# Patient Record
Sex: Female | Born: 1950 | Race: Black or African American | Hispanic: No | Marital: Married | Smoking: Never smoker
Health system: Southern US, Community
[De-identification: ages and names within clinical notes are randomized; demographics above are authoritative.]

## PROBLEM LIST (undated history)

## (undated) DIAGNOSIS — D649 Anemia, unspecified: Secondary | ICD-10-CM

## (undated) DIAGNOSIS — I10 Essential (primary) hypertension: Secondary | ICD-10-CM

## (undated) HISTORY — PX: ABDOMINAL HYSTERECTOMY: SHX81

---

## 2001-05-15 ENCOUNTER — Ambulatory Visit (HOSPITAL_COMMUNITY): Admission: RE | Admit: 2001-05-15 | Discharge: 2001-05-15 | Payer: Self-pay | Admitting: Family Medicine

## 2001-05-15 ENCOUNTER — Encounter: Payer: Self-pay | Admitting: Family Medicine

## 2002-05-18 ENCOUNTER — Encounter: Payer: Self-pay | Admitting: Family Medicine

## 2002-05-18 ENCOUNTER — Ambulatory Visit (HOSPITAL_COMMUNITY): Admission: RE | Admit: 2002-05-18 | Discharge: 2002-05-18 | Payer: Self-pay | Admitting: Family Medicine

## 2003-06-01 ENCOUNTER — Ambulatory Visit (HOSPITAL_COMMUNITY): Admission: RE | Admit: 2003-06-01 | Discharge: 2003-06-01 | Payer: Self-pay | Admitting: Family Medicine

## 2003-12-28 ENCOUNTER — Inpatient Hospital Stay (HOSPITAL_COMMUNITY): Admission: AD | Admit: 2003-12-28 | Discharge: 2003-12-31 | Payer: Self-pay | Admitting: Family Medicine

## 2008-12-18 ENCOUNTER — Emergency Department (HOSPITAL_COMMUNITY): Admission: EM | Admit: 2008-12-18 | Discharge: 2008-12-18 | Payer: Self-pay | Admitting: Emergency Medicine

## 2009-02-04 ENCOUNTER — Emergency Department (HOSPITAL_COMMUNITY): Admission: EM | Admit: 2009-02-04 | Discharge: 2009-02-04 | Payer: Self-pay | Admitting: Emergency Medicine

## 2009-08-16 ENCOUNTER — Emergency Department (HOSPITAL_COMMUNITY): Admission: EM | Admit: 2009-08-16 | Discharge: 2009-08-16 | Payer: Self-pay | Admitting: Emergency Medicine

## 2009-10-26 ENCOUNTER — Emergency Department (HOSPITAL_COMMUNITY): Admission: EM | Admit: 2009-10-26 | Discharge: 2009-10-26 | Payer: Self-pay | Admitting: Emergency Medicine

## 2010-09-09 LAB — URINALYSIS, ROUTINE W REFLEX MICROSCOPIC
Glucose, UA: NEGATIVE mg/dL
Hgb urine dipstick: NEGATIVE
Ketones, ur: NEGATIVE mg/dL
Protein, ur: NEGATIVE mg/dL
Urobilinogen, UA: 0.2 mg/dL (ref 0.0–1.0)

## 2010-11-02 NOTE — H&P (Signed)
NAME:  Janet Short, Janet Short                        ACCOUNT NO.:  [id number]   MEDICAL RECORD NO.:  [id number]                   PATIENT TYPE:  INP   LOCATION:  A310                                 FACILITY:  [facility]   PHYSICIAN:  Annia Friendly. Loleta Chance, M.D.                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  The patient is admitted for  treatment of moderate dehydration based on lab work on December 26, 2003, in the  office.  Office lab on December 26, 2003, revealed the following:  BUN 71,  creatinine 3.1, sodium 125, potassium 5.6, chloride 89, CO2 22.  The patient  has been experiencing weakness, general malaise, increased thirst, increased  frequency of urination as presenting symptoms on the office visit of December 20, 2003.  Serum blood sugar in the office revealed a value of 750 with a BUN 56  and creatinine of 1.6.  The patient wanted to be treated as an outpatient  due to lack of insurance.  Urine culture collected on December 21, 2003, grew  greater than 100,000 per mL of E coli.  The patient daily with Avandia,  NovoLog 70/30 and Levaquin.  The patient indicated that she felt better with  the treatment plan.  However, labs on December 26, 2003, demonstrated increase  in BUN and creatinine.  Therefore, the patient was admitted as stated  earlier.  Medical history is also positive for hypertension.  Medical  history is negative for tuberculosis, cancer, sickle cell, asthma and  seizure disorder.   MEDICATIONS:  Prescribed medications on admission:  1. Ziac 10 mg p.o. every day.  2. NovoLog 70/30 40 units subcu every morning and 10 units subcu every     evening.  3. Avandia 4 mg p.o. every day.  4. Diovan 160 mg p.o. every day.   ALLERGIES:  The patient is not allergic to any known medication.   HABITS:   Negative for use of ethanol and tobacco products.  The patient  denies the use of street drugs.   SEXUALLY TRANSMITTED DISEASE HISTORY:  Negative for gonorrhea, syphilis,  herpes, and HIV infection.   PAST MEDICAL HISTORY:  Positive for hospitalization for pneumonia at [facility], and hysterectomy secondary to uterine fibroids.   FAMILY HISTORY:  Mother deceased at age 70 secondary to natural causes;  father deceased at age 62 secondary to cancer, (type unknown).  Two brothers  living at age 61 and age 47, health unknown; three brothers deceased, one in  his 5s secondary to complications of fall, age 65 secondary to  complications of diabetes, and one in his 34s, cause unknown; four sisters  are living at age 32 with history of hypertension and diabetes,  one in her  27s with a history of hypertension, age 45 with a history of hypertension,  and age 3 with a history of hypertension.   REVIEW OF SYSTEMS:  Negative for epistaxis, bleeding gums, chronic cough,  hemoptysis, shortness of breath, chest pain, dysuria, gross hematuria,  vaginal bleeding, edema of legs, melena, diarrhea, constipation, significant  weight loss, etc.   Review of systems positive for headache recently, lack of energy, episodic  vaginal itching, episodic nausea and vomiting over the past two weeks.   PHYSICAL EXAMINATION:  GENERAL APPEARANCE:  A slightly short, medium-framed,  alert, middle-aged black female, who appeared not to feel well, but no  apparent respiratory distress.  VITAL SIGNS:  Temperature 98.2, pulse 78, respirations 20.  Blood pressure  93/56.  HEENT:  Head normocephalic. Ears:  Normal auricles.  External canals patent.  Tympanic membrane pearly gray.  Eyes:  Lids negative for ptosis.  Sclerae  white.  Pupils round, equal and reactive to light.  Extraocular movements  intact.  Nose negative for discharge.  Mouth:  Positive missing teeth.  Remaining dentition good.  No bleeding gums.   Posterior pharynx positive for  white pin-head size ulcers involving distal roof of mouth.  NECK:  Negative for adenopathy or thyromegaly.  Supraclavicular space:  No  palpable nodes.  LUNGS:  Clear.  HEART:  Audible S1 and S2 without murmur.  Regular rate and rhythm.  BREASTS:  No skin changes. No nodule on palpation, nipples erect without  discharge.  ABDOMEN:  Obese, hyperactive bowel sounds.  Soft, nontender all four  quadrants.  No palpable mass.  No organomegaly.  PELVIC:  External genitalia normal female.  RECTAL:  Deferred.  EXTREMITIES:  No edema of tibia.  No joint swelling, no joint redness, no  joint hotness.  Palpable dorsalis pedis bilaterally.  NEUROLOGICAL:  Cranial nerves II-XII appeared intact.   LABORATORY DATA:  White count 10.1, hemoglobin 10.4, hematocrit 29.6,  platelets 598,000.  Sodium 128, potassium 5.5, chloride 92, CO2 27, glucose  112, BUN 50, creatinine 2.2, calcium 8.8.  Urinalysis:  Specific gravity  1.015, pH 5, no glucose, no bilirubin, no ketones, large amount of blood,  protein 30 mg/dl, nitrite negative, large amount of leukocytes, many  bacteria.   IMPRESSION:  1. Moderate dehydration.  2. New onset diabetes mellitus.  3. Electrolyte imbalance.  4. Treated urinary tract infection.   SECONDARY DIAGNOSIS:  Hypertension.   PLAN:  1. IV fluids using normal saline at 200 cc per hour.  2. Retic count and anemia panel.  3. Guaiac stool daily x2.  4. Diet:  Carbohydrate modified, 4 gram sodium.  5. Fasting lipid profile.  6. Accu-Chek a.c. and bedtime.  7. Hemoglobin A1c.  8. Avandamet 2/500 p.o. every day with breakfast.  9. Novolin 70/30 40 units subcu every morning.  10.      Diabetic education.  11.      Ziac 10 mg one tablet p.o. every day.  12.      Diovan 80 mg p.o. daily.  13.      Iron supplements.      ___________________________________________                                            Annia Friendly. Loleta Chance, M.D.  [name]  D:   12/28/2003  T:  12/28/2003  Job:  [id number]

## 2010-11-02 NOTE — Discharge Summary (Signed)
NAME:  Janet Short, Janet Short                        ACCOUNT NO.:  [id number]   MEDICAL RECORD NO.:  [id number]                   PATIENT TYPE:  INP   LOCATION:  A310                                 FACILITY:  [facility]   PHYSICIAN:  Annia Friendly. Loleta Chance, M.D.                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  The patient was admitted for treatment of  moderate dehydration based on lab work, of December 26, 2003, in the office.  Office lab, on December 26, 2003, revealed the following:  BUN 71, creatinine  3.1, sodium 125, potassium 5.6, chloride 89, CO2 22.  The patient had been  experiencing weakness, general malaise, increased thirst, increased  frequency of urination, and presented to the office on December 20, 2003.  A  blood sugar, in the office, revealed a value of 750 with a BUN of 56 and  creatinine 1.6.  The patient wanted to be treated as an outpatient due to  lack of insurance.  Urine culture collected, on December 21, 2003, grew greater  than 100,000 colonies per ml of E. coli.  The patient was treated daily with  Avandia, Novolog 70/30, and Levaquin.  The patient indicated that she felt  better with this treatment plan; however, labs on December 26, 2003,  demonstrated increasing BUN and creatinine; therefore, the patient was  admitted as stated earlier.   MEDICAL HISTORY:  Positive for hypertension.   The patient was not allergic to any known medication.   HABITS:  Negative for use of ethanol or tobacco products.   FAMILY HISTORY:  Revealed mother deceased at age 43 secondary to natural  causes.  Father deceased at age 68 secondary to cancer (type unknown).  Two  brothers living ages 10 and 83 and their health unknown.  Three brothers  deceased, one in his 80s secondary to complication of fall, age 45  secondary  to complications of diabetes, and one in his 86s cause unknown.  Four  sisters living,  age 43 with a history of hypertension and diabetes, one in  her 45s with a history of hypertension, age 63 with a history of  hypertension, and age 55 with a history for hypertension.   PAST MEDICAL HISTORY:  1. Positive hospitalization for pneumonia at [facility].  2. Hysterectomy secondary to uterine fibroids.   PROBLEM LIST:  1. Moderate dehydration.   PHYSICAL EXAMINATION:  GENERAL APPEARANCE:  Revealed a slightly short,  overweight, medium framed, middle-aged, black female who appeared not to  feel well but no apparent respiratory distress.  SKIN:  Warm and dry.  HEENT:  Mouth demonstrated missing teeth and remaining dentition good.  Oral  mucosa was moist.  LUNGS:  Clear.  HEART:  Revealed audible S1 and S2 without murmur.  Rhythm is regular and  rate within normal limits.  ABDOMEN:  Obese with hypoactive bowel sounds.  Abdomen was soft and  nontender in all four quadrants.  Abdominal exam demonstrated no palpable  mass or organomegaly.  EXTREMITIES:  Demonstrated no tibial edema.  NEUROLOGIC:  Intact.   SIGNIFICANT LABS ON ADMISSION:  White count 10.1, hemoglobin 10.4,  hematocrit 29.6, platelets 598,000.  Sodium 128, potassium 5.5, chloride 92,  CO2 27, glucose 112, BUN 15, creatinine 2.2.  Urinalysis demonstrated the  following:  Status post 1.015, pH 5, no glucose, no bilirubin, no ketones,  large amount of blood, protein 30 mg/dL, nitrate negative, large amount of  leukocyte and many bacteria.  The patient was treated with IV fluids using normal saline at 200 cc/hr x 24  hours, then 150 cc/hr.  Accu-Chek a.c. and bedtime.  MET-7 early morning x  3.  A dietician consult and other supportive measures.  Repeat electrolytes,  on December 29, 2003, revealed the following:  Sodium 133, potassium 5.9,  chloride 101, CO2 26, glucose 137, BUN 32, creatinine 1.6.  Repeat   electrolytes, on December 30, 2003, were as follows:  Sodium 137, potassium 4.9,  chloride 105, CO2 25, glucose 122, BUN 12, creatinine 1.1.  The patient  feels significantly better.  She has remained alert and oriented to person,  place and time.  Energy level is returning.  Her appetite is good.  The  patient will be discharged to home on December 30, 2003.  1. New onset of diabetes mellitus on insulin.  Serum glucose, on admission,     was 112.  Her hemoglobin A1c was 10.6% (normal 4.6 to 6.1).  A urinalysis     revealed no glucose.  The patient's blood sugar has been controlled well     throughout this hospitalization.  She has remained alert and oriented to     person, place and time.  She has no complaint of nausea, vomiting, or     stomach pain at the time of discharge.  The patient will be continued on     insulin as she was treated with insulin during this hospitalization.  She     will monitor her glucose from 2-3 times per day.  The dietician did see     the patient during this hospitalization.  The patient will be discharged     to home on December 31, 2003.  2. A treated urinary tract infection secondary to Escherichia coli.  The     patient has been continued on Levaquin 250 mg p.o. every day.  A urine     culture has demonstrated no growth thus far.  History is negative for     dysuria, gross hematuria, back pain, fever, chills, or suprapubic pain at     the time of discharge.  3. Hyperlipidemia.  A fasting lipid profile collected, on December 29, 2003 at     0505, demonstrated the following:  Total cholesterol 151 mg/dL,     triglycerides 045 mg/dL, HDL 22 mg/dL, LDL 82 mg/dL.  The patient has     been treated with a low cholesterol diet in addition to calorie and     sodium restriction.  The patient will be treated with cholesterol     lowering medicine as an outpatient.  4. Chronic anemia.  Hemoglobin, on admission, was 10.4, hematocrit 29.6.    Anemia panel demonstrated the following,  on December 29, 2003 at 0505:  Iron     30 mcg/dL (normal 42 to 981), total iron-binding capacity 222 pcg/dL     (normal 191 to 478), iron percent saturation 14 (normal 20-55), B-12     greater than 2,000, serum folate 10.3 ng/ml, ferritin 764 ng/ml.  The     patient is on an iron supplement.  She will be continued on iron     supplement as an outpatient.  The order was given to guaiac stools during     this hospitalization.  5. Treated urinary tract infection secondary to E. coli.  The patient will     be continued on Levaquin 250 mg p.o. every day times a total of 7 days.     The patient is not complaining of dysuria, gross hematuria, back pain,     fever, or chills.  Urine culture thus far has demonstrated no growth.     Urine on admission demonstrated the presence of a large amount of     leukocyte and many bacteria.   INSTRUCTIONS AT TIME OF DISCHARGE:  1. Diet:  Carbohydrate modified, 1600 calorie, low salt and cholesterol.  2. Activity:  No restriction.  3. Medications:     a. Aspirin 81 mg p.o. every day.     b. Avandomet 2/500 one tablet twice a day with meals.     c. Novolog 70/30, 30 units subcu every morning.     d. Ziac 10 mg one tablet every day.     e. Ferrous sulfate 325 mg one tablet every day.     f. Tylenol 500 mg p.o. q.6-8h. p.r.n. for pain.  4. Accu-Chek 7 a.m. and 5 p.m. every day.  5. Followup in the office x 1 week.   FINAL PRIMARY DIAGNOSES:  1. Moderate dehydration.  2. New onset diabetes mellitus.  3. Electrolyte imbalance.  4. Treated urinary tract infection.   SECONDARY DIAGNOSES:  1. Hypertension.  2. Hypertriglyceridemia.     ___________________________________________                                         Annia Friendly. Loleta Chance, M.D.   [name]  D:  12/30/2003  T:  12/30/2003  Job:  [id number]

## 2011-12-06 ENCOUNTER — Emergency Department (HOSPITAL_COMMUNITY)
Admission: EM | Admit: 2011-12-06 | Discharge: 2011-12-06 | Disposition: A | Discharge status: Home / Self Care | Payer: Self-pay | Attending: Emergency Medicine | Admitting: Emergency Medicine

## 2011-12-06 ENCOUNTER — Encounter (HOSPITAL_COMMUNITY): Payer: Self-pay | Admitting: *Deleted

## 2011-12-06 DIAGNOSIS — K029 Dental caries, unspecified: Secondary | ICD-10-CM | POA: Insufficient documentation

## 2011-12-06 DIAGNOSIS — I10 Essential (primary) hypertension: Secondary | ICD-10-CM | POA: Insufficient documentation

## 2011-12-06 DIAGNOSIS — E119 Type 2 diabetes mellitus without complications: Secondary | ICD-10-CM | POA: Insufficient documentation

## 2011-12-06 DIAGNOSIS — K0889 Other specified disorders of teeth and supporting structures: Secondary | ICD-10-CM

## 2011-12-06 HISTORY — DX: Essential (primary) hypertension: I10

## 2011-12-06 MED ORDER — IBUPROFEN 800 MG PO TABS
800.0000 mg | ORAL_TABLET | Freq: Once | ORAL | Status: AC
  Administered 2011-12-06: 800 mg via ORAL
  Filled 2011-12-06: qty 1

## 2011-12-06 MED ORDER — PENICILLIN V POTASSIUM 500 MG PO TABS: 500.0000 mg | ORAL_TABLET | Freq: Four times a day (QID) | ORAL | Status: AC

## 2011-12-06 MED ORDER — HYDROCODONE-ACETAMINOPHEN 5-325 MG PO TABS: 1.0000 | ORAL_TABLET | Freq: Four times a day (QID) | ORAL | Status: AC | PRN

## 2011-12-06 MED ORDER — PENICILLIN V POTASSIUM 250 MG PO TABS
500.0000 mg | ORAL_TABLET | Freq: Once | ORAL | Status: AC
  Administered 2011-12-06: 500 mg via ORAL
  Filled 2011-12-06: qty 2

## 2011-12-06 MED ORDER — HYDROCODONE-ACETAMINOPHEN 5-325 MG PO TABS
1.0000 | ORAL_TABLET | Freq: Once | ORAL | Status: AC
  Administered 2011-12-06: 1 via ORAL
  Filled 2011-12-06: qty 1

## 2011-12-06 NOTE — Discharge Instructions (Signed)
Dental Pain  A tooth ache may be caused by cavities (tooth decay). Cavities expose the nerve of the tooth to air and hot or cold temperatures. It may come from an infection or abscess (also called a boil or furuncle) around your tooth. It is also often caused by dental caries (tooth decay). This causes the pain you are having.  DIAGNOSIS   Your caregiver can diagnose this problem by exam.  TREATMENT   · If caused by an infection, it may be treated with medications which kill germs (antibiotics) and pain medications as prescribed by your caregiver. Take medications as directed.  · Only take over-the-counter or prescription medicines for pain, discomfort, or fever as directed by your caregiver.  · Whether the tooth ache today is caused by infection or dental disease, you should see your dentist as soon as possible for further care.  SEEK MEDICAL CARE IF:  The exam and treatment you received today has been provided on an emergency basis only. This is not a substitute for complete medical or dental care. If your problem worsens or new problems (symptoms) appear, and you are unable to meet with your dentist, call or return to this location.  SEEK IMMEDIATE MEDICAL CARE IF:   · You have a fever.  · You develop redness and swelling of your face, jaw, or neck.  · You are unable to open your mouth.  · You have severe pain uncontrolled by pain medicine.  MAKE SURE YOU:   · Understand these instructions.  · Will watch your condition.  · Will get help right away if you are not doing well or get worse.  Document Released: 06/03/2005 Document Revised: 05/23/2011 Document Reviewed: 01/20/2008  [employer]® Patient Information ©2012 [employer].

## 2011-12-06 NOTE — ED Notes (Signed)
Pain rt mandibular molar.  Onset  Last pm

## 2011-12-06 NOTE — ED Provider Notes (Signed)
History     CSN: [id number]  Arrival date & time 12/06/11  1207   First MD Initiated Contact with Patient 12/06/11 1326      Chief Complaint  Patient presents with  . Dental Pain    (Consider location/radiation/quality/duration/timing/severity/associated sxs/prior treatment) HPI Comments: Started hurting in R upper molar area last PM.  No fever or chills.  Has dentist at [facility].  Patient is a 61 y.o. female presenting with tooth pain. The history is provided by the patient. No language interpreter was used.  Dental PainThe primary symptoms include mouth pain. Primary symptoms do not include dental injury or fever. The symptoms are unchanged.  Additional symptoms do not include: trismus and facial swelling.    Past Medical History  Diagnosis Date  . Diabetes mellitus   . Hypertension     Past Surgical History  Procedure Date  . Abdominal hysterectomy     History reviewed. No pertinent family history.  History  Substance Use Topics  . Smoking status: Never Smoker   . Smokeless tobacco: Not on file  . Alcohol Use: Yes    OB History    Grav Para Term Preterm Abortions TAB SAB Ect Mult Living                  Review of Systems  Constitutional: Negative for fever and chills.  HENT: Negative for facial swelling.   All other systems reviewed and are negative.    Allergies  Review of patient's allergies indicates no known allergies.  Home Medications   Current Outpatient Rx  Name Route Sig Dispense Refill  . LISINOPRIL 20 MG PO TABS Oral Take 20 mg by mouth daily.    Marland Kitchen METFORMIN HCL 500 MG PO TABS Oral Take 500 mg by mouth 2 (two) times daily with a meal.    . PRESCRIPTION MEDICATION Oral Take 10 mg by mouth daily. For blood pressure    . HYDROCODONE-ACETAMINOPHEN 5-325 MG PO TABS Oral Take 1 tablet by mouth every 6 (six) hours as needed for pain. 20 tablet 0  . PENICILLIN V POTASSIUM 500 MG PO TABS Oral Take 1 tablet (500 mg total) by mouth 4 (four) times  daily. 40 tablet 0    BP 177/76  Pulse 61  Temp 97.6 F (36.4 C) (Oral)  Resp 20  Ht 5\' 1"  (1.549 m)  Wt 187 lb (84.823 kg)  BMI 35.33 kg/m2  SpO2 100%  Physical Exam  Nursing note and vitals reviewed. Constitutional: She is oriented to person, place, and time. She appears well-developed and well-nourished. No distress.  HENT:  Head: Normocephalic and atraumatic.  Mouth/Throat: Uvula is midline, oropharynx is clear and moist and mucous membranes are normal. Dental caries present. No dental abscesses or uvula swelling.    Eyes: EOM are normal.  Neck: Normal range of motion.  Cardiovascular: Normal rate, regular rhythm and normal heart sounds.   Pulmonary/Chest: Effort normal and breath sounds normal.  Abdominal: Soft. She exhibits no distension. There is no tenderness.  Musculoskeletal: Normal range of motion.  Neurological: She is alert and oriented to person, place, and time.  Skin: Skin is warm and dry.  Psychiatric: She has a normal mood and affect. Judgment normal.    ED Course  Procedures (including critical care time)  Labs Reviewed - No data to display No results found.   1. Pain, dental       MDM  No obvious abscess. rx-hydrocodone, 20 rx- pen VK 500 mg , 40 OTC  ibuprofen F/u with your dentist ASAP        Worthy Rancher, [place] 12/06/11 1403

## 2011-12-06 NOTE — ED Provider Notes (Signed)
Medical screening examination/treatment/procedure(s) were performed by non-physician practitioner and as supervising physician I was immediately available for consultation/collaboration.  Shelda Jakes, MD 12/06/11 [phone]

## 2013-02-01 ENCOUNTER — Encounter (HOSPITAL_COMMUNITY): Payer: Self-pay | Admitting: Emergency Medicine

## 2013-02-01 ENCOUNTER — Emergency Department (HOSPITAL_COMMUNITY)
Admission: EM | Admit: 2013-02-01 | Discharge: 2013-02-01 | Disposition: A | Discharge status: Home / Self Care | Payer: BC Managed Care – PPO | Attending: Emergency Medicine | Admitting: Emergency Medicine

## 2013-02-01 ENCOUNTER — Emergency Department (HOSPITAL_COMMUNITY): Payer: BC Managed Care – PPO

## 2013-02-01 DIAGNOSIS — Y929 Unspecified place or not applicable: Secondary | ICD-10-CM | POA: Insufficient documentation

## 2013-02-01 DIAGNOSIS — Y9389 Activity, other specified: Secondary | ICD-10-CM | POA: Insufficient documentation

## 2013-02-01 DIAGNOSIS — I10 Essential (primary) hypertension: Secondary | ICD-10-CM | POA: Insufficient documentation

## 2013-02-01 DIAGNOSIS — E119 Type 2 diabetes mellitus without complications: Secondary | ICD-10-CM | POA: Insufficient documentation

## 2013-02-01 DIAGNOSIS — S8982XA Other specified injuries of left lower leg, initial encounter: Secondary | ICD-10-CM

## 2013-02-01 DIAGNOSIS — S8990XA Unspecified injury of unspecified lower leg, initial encounter: Secondary | ICD-10-CM | POA: Insufficient documentation

## 2013-02-01 DIAGNOSIS — Z7982 Long term (current) use of aspirin: Secondary | ICD-10-CM | POA: Insufficient documentation

## 2013-02-01 DIAGNOSIS — X500XXA Overexertion from strenuous movement or load, initial encounter: Secondary | ICD-10-CM | POA: Insufficient documentation

## 2013-02-01 DIAGNOSIS — Z79899 Other long term (current) drug therapy: Secondary | ICD-10-CM | POA: Insufficient documentation

## 2013-02-01 NOTE — ED Notes (Signed)
States that she started having pain in her left calf today while at work.  States her leg is painful when trying to bear weight.

## 2013-02-01 NOTE — ED Provider Notes (Signed)
CSN: [id number]     Arrival date & time 02/01/13  1154 History     First MD Initiated Contact with Patient 02/01/13 1242     Chief Complaint  Patient presents with  . Leg Pain   (Consider location/radiation/quality/duration/timing/severity/associated sxs/prior Treatment) HPI Comments: Janet Short is a 62 y.o. Female who states that her knee has been hurting since this morning. She has pain when she attempts to walk. She is unable to work because of the pain. She states for the last 2 nights. She has been "exercising" and thinks that she may have hurt her knee. The exercises that she is doing is forcefully kicking her legs straight out, while standing.  She is worried that she has knocked her joint out of place. She has not tried any medication for it. She denies lower leg pain and thigh pain, back, pain, weakness, dizziness, shortness of breath, chest pain, nausea or vomiting. There are no other known modifying factors.  Patient is a 62 y.o. female presenting with leg pain. The history is provided by the patient.  Leg Pain   Past Medical History  Diagnosis Date  . Diabetes mellitus   . Hypertension    Past Surgical History  Procedure Laterality Date  . Abdominal hysterectomy     No family history on file. History  Substance Use Topics  . Smoking status: Never Smoker   . Smokeless tobacco: Not on file  . Alcohol Use: Yes   OB History   Grav Para Term Preterm Abortions TAB SAB Ect Mult Living                 Review of Systems  All other systems reviewed and are negative.    Allergies  Review of patient's allergies indicates no known allergies.  Home Medications   Current Outpatient Rx  Name  Route  Sig  Dispense  Refill  . aspirin EC 81 MG tablet   Oral   Take 81 mg by mouth daily.         . bisoprolol-hydrochlorothiazide (ZIAC) 10-6.25 MG per tablet   Oral   Take 1 tablet by mouth daily.         Marland Kitchen lisinopril (PRINIVIL,ZESTRIL) 20 MG tablet   Oral  Take 20 mg by mouth daily.         . metFORMIN (GLUCOPHAGE) 500 MG tablet   Oral   Take 500 mg by mouth 2 (two) times daily with a meal.          BP 145/67  Pulse 75  Temp(Src) 98 F (36.7 C) (Oral)  Resp 18  Ht 5\' 1"  (1.549 m)  Wt 182 lb (82.555 kg)  BMI 34.41 kg/m2  SpO2 99% Physical Exam  Nursing note and vitals reviewed. Constitutional: She is oriented to person, place, and time. She appears well-developed.  Overweight  HENT:  Head: Normocephalic and atraumatic.  Eyes: Conjunctivae and EOM are normal. Pupils are equal, round, and reactive to light.  Neck: Normal range of motion and phonation normal. Neck supple.  Cardiovascular: Normal rate and intact distal pulses.   Pulmonary/Chest: Effort normal. She exhibits no tenderness.  Musculoskeletal:  Decreased active range of motion left knee, secondary to pain. Tenderness in the left popliteal space. There is no associated swelling or deformity of the left knee. There is no lower extremity edema, swelling or localized tenderness. There is normal  Pulse and sensation in the left foot.  Neurological: She is alert and oriented to person, place,  and time. She has normal strength. She exhibits normal muscle tone.  Skin: Skin is warm and dry.  Psychiatric: She has a normal mood and affect. Her behavior is normal. Judgment and thought content normal.    ED Course   Procedures (including critical care time)  AcE wrap applied by nursing.  Labs Reviewed - No data to display Dg Knee Complete 4 Views Left  02/01/2013   *RADIOLOGY REPORT*  Clinical Data: Twisting injury 2 days ago.  Left knee pain.  LEFT KNEE - COMPLETE 4+ VIEW  Comparison: Plain films left knee 02/04/2009.  Findings: No acute bony or joint abnormality is identified.  No joint effusion is present.  Mild degenerative change is seen about the knee.  IMPRESSION: No acute finding.   Original Report Authenticated By: Holley Dexter, M.D.   1. Knee hyperextension  injury, left, initial encounter     MDM  Minor knee trauma, without fracture. Doubt DVT, Cellulitis, ligamentous injury.      Nursing Notes Reviewed/ Care Coordinated Applicable Imaging Reviewed Interpretation of Laboratory Data incorporated into ED treatment  Plan: Home Medications- IBU; Home Treatments- Ace wrap and elevation for 2 days. Avoid exercising until improved/; return here if the recommended treatment, does not improve the symptoms; Recommended follow up- PCP prn                     Flint Melter, MD 02/01/13 2024

## 2013-02-01 NOTE — ED Notes (Signed)
Discharge instructions reviewed with pt, questions answered. Pt verbalized understanding.  

## 2013-02-01 NOTE — Progress Notes (Signed)
No PCP listed in computer, however, pt states she uses [facility] and this is placed in computer

## 2013-02-01 NOTE — ED Notes (Signed)
Pt presents with left posterior leg pain, behind the knee. Pt denies injury and or trauma to said leg. Pt does however state she has increased her " kick exercise lately". Bilateral legs measured at same site, no differences noted. Left leg at knee, ( site marked) measures 16 1/4 inches, and rt knee measures 16 1/2 inches. No redness or swelling noted. Distal pulses equal and strong. Denies SOB at this time. NAD noted

## 2013-11-06 ENCOUNTER — Encounter (HOSPITAL_COMMUNITY): Payer: Self-pay | Admitting: Emergency Medicine

## 2013-11-06 ENCOUNTER — Emergency Department (HOSPITAL_COMMUNITY)
Admission: EM | Admit: 2013-11-06 | Discharge: 2013-11-06 | Disposition: A | Discharge status: Home / Self Care | Payer: 59 | Attending: Emergency Medicine | Admitting: Emergency Medicine

## 2013-11-06 DIAGNOSIS — Z79899 Other long term (current) drug therapy: Secondary | ICD-10-CM | POA: Insufficient documentation

## 2013-11-06 DIAGNOSIS — L309 Dermatitis, unspecified: Secondary | ICD-10-CM

## 2013-11-06 DIAGNOSIS — E119 Type 2 diabetes mellitus without complications: Secondary | ICD-10-CM | POA: Insufficient documentation

## 2013-11-06 DIAGNOSIS — I10 Essential (primary) hypertension: Secondary | ICD-10-CM | POA: Insufficient documentation

## 2013-11-06 DIAGNOSIS — Z7982 Long term (current) use of aspirin: Secondary | ICD-10-CM | POA: Insufficient documentation

## 2013-11-06 DIAGNOSIS — Z760 Encounter for issue of repeat prescription: Secondary | ICD-10-CM | POA: Insufficient documentation

## 2013-11-06 DIAGNOSIS — L259 Unspecified contact dermatitis, unspecified cause: Secondary | ICD-10-CM | POA: Insufficient documentation

## 2013-11-06 LAB — CBG MONITORING, ED: GLUCOSE-CAPILLARY: 93 mg/dL (ref 70–99)

## 2013-11-06 MED ORDER — TRIAMCINOLONE ACETONIDE 0.1 % EX CREA: 1.0000 "application " | TOPICAL_CREAM | Freq: Two times a day (BID) | CUTANEOUS | Status: DC

## 2013-11-06 MED ORDER — METFORMIN HCL 500 MG PO TABS: 500.0000 mg | ORAL_TABLET | Freq: Two times a day (BID) | ORAL | Status: DC

## 2013-11-06 MED ORDER — LISINOPRIL 20 MG PO TABS: 20.0000 mg | ORAL_TABLET | Freq: Every day | ORAL | Status: DC

## 2013-11-06 MED ORDER — BISOPROLOL-HYDROCHLOROTHIAZIDE 10-6.25 MG PO TABS: 1.0000 | ORAL_TABLET | Freq: Every day | ORAL | Status: DC

## 2013-11-06 NOTE — ED Notes (Signed)
Pt c/o abscess to back of neck since Wednesday.

## 2013-11-06 NOTE — ED Notes (Signed)
Patient with no complaints at this time. Respirations even and unlabored. Skin warm/dry. Discharge instructions reviewed with patient at this time. Patient given opportunity to voice concerns/ask questions. Patient discharged at this time and left Emergency Department with steady gait.   

## 2013-11-06 NOTE — Discharge Instructions (Signed)
Contact Dermatitis Contact dermatitis is a reaction to certain substances that touch the skin. Contact dermatitis can be either irritant contact dermatitis or allergic contact dermatitis. Irritant contact dermatitis does not require previous exposure to the substance for a reaction to occur.Allergic contact dermatitis only occurs if you have been exposed to the substance before. Upon a repeat exposure, your body reacts to the substance.  CAUSES  Many substances can cause contact dermatitis. Irritant dermatitis is most commonly caused by repeated exposure to mildly irritating substances, such as:  Makeup.  Soaps.  Detergents.  Bleaches.  Acids.  Metal salts, such as nickel. Allergic contact dermatitis is most commonly caused by exposure to:  Poisonous plants.  Chemicals (deodorants, shampoos).  Jewelry.  Latex.  Neomycin in triple antibiotic cream.  Preservatives in products, including clothing. SYMPTOMS  The area of skin that is exposed may develop:  Dryness or flaking.  Redness.  Cracks.  Itching.  Pain or a burning sensation.  Blisters. With allergic contact dermatitis, there may also be swelling in areas such as the eyelids, mouth, or genitals.  DIAGNOSIS  Your caregiver can usually tell what the problem is by doing a physical exam. In cases where the cause is uncertain and an allergic contact dermatitis is suspected, a patch skin test may be performed to help determine the cause of your dermatitis. TREATMENT Treatment includes protecting the skin from further contact with the irritating substance by avoiding that substance if possible. Barrier creams, powders, and gloves may be helpful. Your caregiver may also recommend:  Steroid creams or ointments applied 2 times daily.  You can store the steroid cream in the refrigerator for a "chilly" effect on your rash. That may decrease itching. Oral steroid medicines may be needed in more severe cases.  Antibiotics or  antibacterial ointments if a skin infection is present.  Antihistamine lotion or an antihistamine taken by mouth to ease itching.  Lubricants to keep moisture in your skin.  Burow's solution to reduce redness and soreness or to dry a weeping rash. Mix one packet or tablet of solution in 2 cups cool water. Dip a clean washcloth in the mixture, wring it out a bit, and put it on the affected area. Leave the cloth in place for 30 minutes. Do this as often as possible throughout the day.  Taking several cornstarch or baking soda baths daily if the area is too large to cover with a washcloth. Harsh chemicals, such as alkalis or acids, can cause skin damage that is like a burn. You should flush your skin for 15 to 20 minutes with cold water after such an exposure. You should also seek immediate medical care after exposure. Bandages (dressings), antibiotics, and pain medicine may be needed for severely irritated skin.  HOME CARE INSTRUCTIONS  Avoid the substance that caused your reaction.  Keep the area of skin that is affected away from hot water, soap, sunlight, chemicals, acidic substances, or anything else that would irritate your skin.  Do not scratch the rash. Scratching may cause the rash to become infected.  You may take cool baths to help stop the itching.  Only take over-the-counter or prescription medicines as directed by your caregiver.  See your caregiver for follow-up care as directed to make sure your skin is healing properly. SEEK MEDICAL CARE IF:   Your condition is not better after 3 days of treatment.  You seem to be getting worse.  You see signs of infection such as swelling, tenderness, redness, soreness, or  warmth in the affected area.  You have any problems related to your medicines. Document Released: 05/31/2000 Document Revised: 08/26/2011 Document Reviewed: 11/06/2010 [facility] Patient Information 2014 [place], [place].  Medication Refill, Emergency Department We  have refilled your medication today as a courtesy to you. It is best for your medical care, however, to take care of getting refills done through your primary caregiver's office. They have your records and can do a better job of follow-up than we can in the emergency department. On maintenance medications, we often only prescribe enough medications to get you by until you are able to see your regular caregiver. This is a more expensive way to refill medications. In the future, please plan for refills so that you will not have to use the emergency department for this. Thank you for your help. Your help allows [place] to better take care of the daily emergencies that enter our department. Document Released: 09/20/2003 Document Revised: 08/26/2011 Document Reviewed: 06/03/2005 [facility] Patient Information 2014 [place], [place].

## 2013-11-08 NOTE — ED Provider Notes (Signed)
CSN: [id number]     Arrival date & time 11/06/13  1239 History   First MD Initiated Contact with Patient 11/06/13 1258     Chief Complaint  Patient presents with  . Abscess     (Consider location/radiation/quality/duration/timing/severity/associated sxs/prior Treatment) HPI Comments: Janet Short is a 63 y.o. Female presenting with an itchy rash on her posterior neck she first noticed 4 days ago.  She reports having an abscess at this same site in the past.  It is nontender to palpation.  There has been no drainage from the site.  She is without other complaint.  She does ask for medication refills however.  She will be establishing care with a new provider now that she has insurance, but has run out of her blood pressure medicine 3 days ago and has just one meformin tablet left.  She reports her cbg's are ok.  She denies headache, shortness of breath and chest pain.     The history is provided by the patient.    Past Medical History  Diagnosis Date  . Diabetes mellitus   . Hypertension    Past Surgical History  Procedure Laterality Date  . Abdominal hysterectomy     No family history on file. History  Substance Use Topics  . Smoking status: Never Smoker   . Smokeless tobacco: Not on file  . Alcohol Use: Yes     Comment: rare   OB History   Grav Para Term Preterm Abortions TAB SAB Ect Mult Living                 Review of Systems  Constitutional: Negative for fever and chills.  Respiratory: Negative for shortness of breath and wheezing.   Skin: Positive for wound. Negative for color change.  Neurological: Negative for numbness.      Allergies  Review of patient's allergies indicates no known allergies.  Home Medications   Prior to Admission medications   Medication Sig Start Date End Date Taking? Authorizing Provider  aspirin EC 81 MG tablet Take 81 mg by mouth daily.    Historical Provider, MD  bisoprolol-hydrochlorothiazide [facility]) 10-6.25 MG per tablet  Take 1 tablet by mouth daily. 11/06/13   Evalee Jefferson, PA-C  lisinopril (PRINIVIL,ZESTRIL) 20 MG tablet Take 1 tablet (20 mg total) by mouth daily. 11/06/13   Evalee Jefferson, PA-C  metFORMIN (GLUCOPHAGE) 500 MG tablet Take 1 tablet (500 mg total) by mouth 2 (two) times daily with a meal. 11/06/13   Evalee Jefferson, PA-C  triamcinolone cream (KENALOG) 0.1 % Apply 1 application topically 2 (two) times daily. Apply sparingly to your rash twice daily. 11/06/13   Evalee Jefferson, PA-C   BP 170/89  Pulse 56  Temp(Src) 98.6 F (37 C)  Resp 18  Ht 5\' 1"  (1.549 m)  Wt 180 lb (81.647 kg)  BMI 34.03 kg/m2  SpO2 100% Physical Exam  Constitutional: She appears well-developed and well-nourished. No distress.  Hypertensive.  HENT:  Head: Normocephalic.  Neck: Neck supple.  Cardiovascular: Normal rate.   Pulmonary/Chest: Effort normal. She has no wheezes.  Musculoskeletal: Normal range of motion. She exhibits no edema.  Skin: There is erythema.  1 cm slightly raised erythematous macule with tiny near confluent vesicles within the macular patch posterior neck, midline.  Excoriations.  No draining, no fluctuance, surrounding redness or red streaking.    ED Course  Procedures (including critical care time) Labs Review Labs Reviewed  CBG MONITORING, ED    Imaging Review No results  found.   EKG Interpretation None      MDM   Final diagnoses:  Dermatitis  Medication refill    Pt was prescribed kenalog cream,  Also given refills for metformin, lisinopril and her ziac.  Discussed her elevated bp and need for recheck within one week.  She has appt with new provider early June.    Evalee Jefferson, PA-C 11/08/13 1223

## 2013-11-08 NOTE — ED Provider Notes (Signed)
Medical screening examination/treatment/procedure(s) were performed by non-physician practitioner and as supervising physician I was immediately available for consultation/collaboration.   EKG Interpretation None       Jalayiah Bibian, MD 11/08/13 1704 

## 2013-12-14 ENCOUNTER — Other Ambulatory Visit (HOSPITAL_COMMUNITY): Payer: Self-pay | Admitting: Internal Medicine

## 2013-12-14 ENCOUNTER — Other Ambulatory Visit (HOSPITAL_COMMUNITY): Payer: Self-pay | Admitting: Family Medicine

## 2013-12-14 DIAGNOSIS — Z780 Asymptomatic menopausal state: Secondary | ICD-10-CM

## 2013-12-14 DIAGNOSIS — Z1231 Encounter for screening mammogram for malignant neoplasm of breast: Secondary | ICD-10-CM

## 2013-12-23 ENCOUNTER — Other Ambulatory Visit (HOSPITAL_COMMUNITY): Payer: 59

## 2013-12-23 ENCOUNTER — Ambulatory Visit (HOSPITAL_COMMUNITY): Payer: 59

## 2013-12-24 ENCOUNTER — Encounter (INDEPENDENT_AMBULATORY_CARE_PROVIDER_SITE_OTHER): Payer: Self-pay | Admitting: *Deleted

## 2013-12-24 ENCOUNTER — Ambulatory Visit (HOSPITAL_COMMUNITY)
Admission: RE | Admit: 2013-12-24 | Discharge: 2013-12-24 | Disposition: A | Discharge status: Home / Self Care | Payer: 59 | Source: Ambulatory Visit | Attending: Internal Medicine | Admitting: Internal Medicine

## 2013-12-24 DIAGNOSIS — Z780 Asymptomatic menopausal state: Secondary | ICD-10-CM | POA: Insufficient documentation

## 2013-12-24 DIAGNOSIS — Z1231 Encounter for screening mammogram for malignant neoplasm of breast: Secondary | ICD-10-CM | POA: Insufficient documentation

## 2013-12-31 ENCOUNTER — Encounter (INDEPENDENT_AMBULATORY_CARE_PROVIDER_SITE_OTHER): Payer: Self-pay | Admitting: *Deleted

## 2013-12-31 ENCOUNTER — Other Ambulatory Visit (INDEPENDENT_AMBULATORY_CARE_PROVIDER_SITE_OTHER): Payer: Self-pay | Admitting: *Deleted

## 2013-12-31 DIAGNOSIS — Z1211 Encounter for screening for malignant neoplasm of colon: Secondary | ICD-10-CM

## 2014-02-16 ENCOUNTER — Telehealth (INDEPENDENT_AMBULATORY_CARE_PROVIDER_SITE_OTHER): Payer: Self-pay | Admitting: *Deleted

## 2014-02-16 DIAGNOSIS — Z1211 Encounter for screening for malignant neoplasm of colon: Secondary | ICD-10-CM

## 2014-02-16 NOTE — Telephone Encounter (Signed)
Patient needs movi prep 

## 2014-02-18 MED ORDER — PEG-KCL-NACL-NASULF-NA ASC-C 100 G PO SOLR: 1.0000 | Freq: Once | ORAL | Status: DC

## 2014-03-02 ENCOUNTER — Telehealth (INDEPENDENT_AMBULATORY_CARE_PROVIDER_SITE_OTHER): Payer: Self-pay | Admitting: *Deleted

## 2014-03-02 NOTE — Telephone Encounter (Signed)
  Procedure: tcs  Reason/Indication:  screening  Has patient had this procedure before?  no  If so, when, by whom and where?    Is there a family history of colon cancer?  no  Who?  What age when diagnosed?    Is patient diabetic?   yes      Does patient have prosthetic heart valve?  no  Do you have a pacemaker?  no  Has patient ever had endocarditis? no  Has patient had joint replacement within last 12 months?  no  Does patient tend to be constipated or take laxatives? no  Is patient on Coumadin, Plavix and/or Aspirin? yes  Medications: asa 81 mg daily, vit d 2000 mg daily, metformin 500 mg bid, bisoprolol/hctz 10/6.25 mg daily, lisinopril 20 mg daily  Allergies: nkda  Medication Adjustment: asa 2 days, hold metformin evening before and morning of  Procedure date & time: 03/23/14 at 730

## 2014-03-02 NOTE — Telephone Encounter (Signed)
agree

## 2014-03-11 ENCOUNTER — Encounter (HOSPITAL_COMMUNITY): Payer: Self-pay | Admitting: Pharmacy Technician

## 2014-03-22 ENCOUNTER — Encounter (INDEPENDENT_AMBULATORY_CARE_PROVIDER_SITE_OTHER): Payer: Self-pay | Admitting: *Deleted

## 2014-04-19 ENCOUNTER — Telehealth (INDEPENDENT_AMBULATORY_CARE_PROVIDER_SITE_OTHER): Payer: Self-pay | Admitting: *Deleted

## 2014-04-19 NOTE — Telephone Encounter (Signed)
agree

## 2014-04-19 NOTE — Telephone Encounter (Signed)
Referring MD/PCP: gosrani   Procedure: tcs  Reason/Indication:  screening  Has patient had this procedure before?  no  If so, when, by whom and where?    Is there a family history of colon cancer?  no  Who?  What age when diagnosed?    Is patient diabetic?   yes      Does patient have prosthetic heart valve?  no  Do you have a pacemaker?  no  Has patient ever had endocarditis? no  Has patient had joint replacement within last 12 months?  no  Does patient tend to be constipated or take laxatives? no  Is patient on Coumadin, Plavix and/or Aspirin? yes  Medications: asa 81 mg daily, vit d 2000 mg daily, metformin 500 mg bid, bisoprolol/hctz 10/6.25 mg daily, lisinopril 20 mg daily  Allergies: nkda  Medication Adjustment: asa 2 days, hold metformin evening before and morning of  Procedure date & time: 05/19/14 at 1030

## 2014-05-18 ENCOUNTER — Other Ambulatory Visit (INDEPENDENT_AMBULATORY_CARE_PROVIDER_SITE_OTHER): Payer: Self-pay | Admitting: *Deleted

## 2014-05-18 DIAGNOSIS — Z1211 Encounter for screening for malignant neoplasm of colon: Secondary | ICD-10-CM

## 2014-05-19 ENCOUNTER — Ambulatory Visit (HOSPITAL_COMMUNITY)
Admission: RE | Admit: 2014-05-19 | Discharge: 2014-05-19 | Disposition: A | Discharge status: Home / Self Care | Payer: 59 | Source: Ambulatory Visit | Attending: Internal Medicine | Admitting: Internal Medicine

## 2014-05-19 ENCOUNTER — Encounter (HOSPITAL_COMMUNITY)
Admission: RE | Disposition: A | Discharge status: Home / Self Care | Payer: Self-pay | Source: Ambulatory Visit | Attending: Internal Medicine

## 2014-05-19 ENCOUNTER — Encounter (HOSPITAL_COMMUNITY): Payer: Self-pay

## 2014-05-19 DIAGNOSIS — D123 Benign neoplasm of transverse colon: Secondary | ICD-10-CM | POA: Insufficient documentation

## 2014-05-19 DIAGNOSIS — I10 Essential (primary) hypertension: Secondary | ICD-10-CM | POA: Diagnosis not present

## 2014-05-19 DIAGNOSIS — K644 Residual hemorrhoidal skin tags: Secondary | ICD-10-CM | POA: Diagnosis not present

## 2014-05-19 DIAGNOSIS — K648 Other hemorrhoids: Secondary | ICD-10-CM | POA: Insufficient documentation

## 2014-05-19 DIAGNOSIS — K649 Unspecified hemorrhoids: Secondary | ICD-10-CM

## 2014-05-19 DIAGNOSIS — Z1211 Encounter for screening for malignant neoplasm of colon: Secondary | ICD-10-CM | POA: Diagnosis present

## 2014-05-19 DIAGNOSIS — E119 Type 2 diabetes mellitus without complications: Secondary | ICD-10-CM | POA: Diagnosis not present

## 2014-05-19 HISTORY — PX: COLONOSCOPY: SHX5424

## 2014-05-19 LAB — GLUCOSE, CAPILLARY: Glucose-Capillary: 138 mg/dL — ABNORMAL HIGH (ref 70–99)

## 2014-05-19 SURGERY — COLONOSCOPY
Anesthesia: Moderate Sedation

## 2014-05-19 MED ORDER — STERILE WATER FOR IRRIGATION IR SOLN
Status: DC | PRN
  Administered 2014-05-19: 11:00:00

## 2014-05-19 MED ORDER — MIDAZOLAM HCL 5 MG/5ML IJ SOLN
INTRAMUSCULAR | Status: DC | PRN
  Administered 2014-05-19: 2 mg via INTRAVENOUS
  Administered 2014-05-19: 1 mg via INTRAVENOUS
  Administered 2014-05-19 (×2): 2 mg via INTRAVENOUS

## 2014-05-19 MED ORDER — MIDAZOLAM HCL 5 MG/5ML IJ SOLN
INTRAMUSCULAR | Status: AC
  Filled 2014-05-19: qty 10

## 2014-05-19 MED ORDER — MEPERIDINE HCL 50 MG/ML IJ SOLN
INTRAMUSCULAR | Status: AC
  Filled 2014-05-19: qty 1

## 2014-05-19 MED ORDER — MEPERIDINE HCL 50 MG/ML IJ SOLN
INTRAMUSCULAR | Status: DC | PRN
  Administered 2014-05-19 (×2): 25 mg via INTRAVENOUS

## 2014-05-19 MED ORDER — SODIUM CHLORIDE 0.9 % IV SOLN
INTRAVENOUS | Status: DC
  Administered 2014-05-19: 09:00:00 via INTRAVENOUS

## 2014-05-19 NOTE — Op Note (Signed)
COLONOSCOPY PROCEDURE REPORT  PATIENT:  Janet Short  MR#:  [id number] Birthdate:  06-08-1951, 63 y.o., female Endoscopist:  Dr. Rogene Houston, MD Referred By:  Dr. Doree Albee,, MD  Procedure Date: 05/19/2014  Procedure:   Colonoscopy  Indications:  Patient is 63 year old African-American female who is undergoing average risk screening colonoscopy. This is patient's first exam.  Informed Consent:  The procedure and risks were reviewed with the patient and informed consent was obtained.  Medications:  Demerol 50 mg IV Versed 7 mg IV  Description of procedure:  After a digital rectal exam was performed, that colonoscope was advanced from the anus through the rectum and colon to the area of the cecum, ileocecal valve and appendiceal orifice. The cecum was deeply intubated. These structures were well-seen and photographed for the record. From the level of the cecum and ileocecal valve, the scope was slowly and cautiously withdrawn. The mucosal surfaces were carefully surveyed utilizing scope tip to flexion to facilitate fold flattening as needed. The scope was pulled down into the rectum where a thorough exam including retroflexion was performed.  Findings:   Prep satisfactory. Small polyp ablated via cold biopsy from splenic flexure. Mucosa of rest of the colon was normal. Normal rectal mucosa. Small hemorrhoids above and below the dentate line.   Therapeutic/Diagnostic Maneuvers Performed:  See above  Complications:  None  Cecal Withdrawal Time:  15 minutes  Impression:  Examination performed to cecum. Small polyp ablated via cold biopsy from splenic flexure. Small internal and external hemorrhoids.  Recommendations:  Standard instructions given. I will contact patient with biopsy results and further recommendations. Given today's findings patient could wait 10 years before her next exam unless this polyp turns out to be sessile serrated polyp in which case she will  need to return in 5 years.  REHMAN,NAJEEB U  05/19/2014 11:22 AM  CC: Dr. Doree Albee, MD & Dr. Rayne Du ref. provider found

## 2014-05-19 NOTE — Discharge Instructions (Signed)
Resume usual medications and diet. °No driving for 24 hours. °Physician will call with biopsy results. ° °Colonoscopy, Care After °These instructions give you information on caring for yourself after your procedure. Your doctor may also give you more specific instructions. Call your doctor if you have any problems or questions after your procedure. °HOME CARE °· Do not drive for 24 hours. °· Do not sign important papers or use machinery for 24 hours. °· You may shower. °· You may go back to your usual activities, but go slower for the first 24 hours. °· Take rest breaks often during the first 24 hours. °· Walk around or use warm packs on your belly (abdomen) if you have belly cramping or gas. °· Drink enough fluids to keep your pee (urine) clear or pale yellow. °· Resume your normal diet. Avoid heavy or fried foods. °· Avoid drinking alcohol for 24 hours or as told by your doctor. °· Only take medicines as told by your doctor. °If a tissue sample (biopsy) was taken during the procedure:  °· Do not take aspirin or blood thinners for 7 days, or as told by your doctor. °· Do not drink alcohol for 7 days, or as told by your doctor. °· Eat soft foods for the first 24 hours. °GET HELP IF: °You still have a small amount of blood in your poop (stool) 2-3 days after the procedure. °GET HELP RIGHT AWAY IF: °· You have more than a small amount of blood in your poop. °· You see clumps of tissue (blood clots) in your poop. °· Your belly is puffy (swollen). °· You feel sick to your stomach (nauseous) or throw up (vomit). °· You have a fever. °· You have belly pain that gets worse and medicine does not help. °MAKE SURE YOU: °· Understand these instructions. °· Will watch your condition. °· Will get help right away if you are not doing well or get worse. °Document Released: 07/06/2010 Document Revised: 06/08/2013 Document Reviewed: 02/08/2013 °ExitCare® Patient Information ©2015 [employer]. This information is not intended to  replace advice given to you by your health care provider. Make sure you discuss any questions you have with your health care provider. ° °

## 2014-05-19 NOTE — H&P (Signed)
Janet Short is an 63 y.o. female.   Chief Complaint: Patient is here for colonoscopy. HPI: Patient is 63 year old African-American female who is here for screening colonoscopy. She denies abdominal pain change in bowel habits or rectal bleeding. This is patient's first exam. Family history is negative for CRC.  Past Medical History  Diagnosis Date  . Diabetes mellitus   . Hypertension     Past Surgical History  Procedure Laterality Date  . Abdominal hysterectomy      History reviewed. No pertinent family history. Social History:  reports that she has never smoked. She does not have any smokeless tobacco history on file. She reports that she drinks alcohol. She reports that she does not use illicit drugs.  Allergies: No Known Allergies  Medications Prior to Admission  Medication Sig Dispense Refill  . aspirin EC 81 MG tablet Take 81 mg by mouth daily.    . bisoprolol-hydrochlorothiazide (ZIAC) 10-6.25 MG per tablet Take 1 tablet by mouth daily. 30 tablet 0  . Cholecalciferol (VITAMIN D PO) Take 1 tablet by mouth daily.    Marland Kitchen lisinopril (PRINIVIL,ZESTRIL) 20 MG tablet Take 1 tablet (20 mg total) by mouth daily. 30 tablet 0  . metFORMIN (GLUCOPHAGE) 500 MG tablet Take 1 tablet (500 mg total) by mouth 2 (two) times daily with a meal. 60 tablet 0  . peg 3350 powder (MOVIPREP) 100 G SOLR Take 1 kit (200 g total) by mouth once. 1 kit 0    Results for orders placed or performed during the hospital encounter of 05/19/14 (from the past 48 hour(s))  Glucose, capillary     Status: Abnormal   Collection Time: 05/19/14 10:39 AM  Result Value Ref Range   Glucose-Capillary 138 (H) 70 - 99 mg/dL   No results found.  ROS  Blood pressure 187/76, pulse 86, temperature 98.1 F (36.7 C), temperature source Oral, resp. rate 15, height 5' 1"  (1.549 m), weight 180 lb (81.647 kg), SpO2 100 %. Physical Exam  Constitutional: She appears well-developed and well-nourished.  HENT:  Mouth/Throat:  Oropharynx is clear and moist.  Eyes: Conjunctivae are normal. No scleral icterus.  Neck: No thyromegaly present.  Cardiovascular: Normal rate, regular rhythm and normal heart sounds.   No murmur heard. Respiratory: Effort normal and breath sounds normal.  GI: Soft. She exhibits no distension and no mass. There is no tenderness.  Musculoskeletal: She exhibits no edema.  Lymphadenopathy:    She has no cervical adenopathy.  Neurological: She is alert.  Skin: Skin is warm and dry.     Assessment/Plan Average risk screening colonoscopy.  Grantham Hippert U 05/19/2014, 10:46 AM

## 2014-05-23 ENCOUNTER — Encounter (HOSPITAL_COMMUNITY): Payer: Self-pay | Admitting: Internal Medicine

## 2014-05-30 ENCOUNTER — Encounter (INDEPENDENT_AMBULATORY_CARE_PROVIDER_SITE_OTHER): Payer: Self-pay | Admitting: *Deleted

## 2014-12-04 ENCOUNTER — Encounter (HOSPITAL_COMMUNITY): Payer: Self-pay | Admitting: *Deleted

## 2014-12-04 ENCOUNTER — Emergency Department (HOSPITAL_COMMUNITY)
Admission: EM | Admit: 2014-12-04 | Discharge: 2014-12-04 | Disposition: A | Discharge status: Home / Self Care | Payer: Commercial Managed Care - HMO | Attending: Emergency Medicine | Admitting: Emergency Medicine

## 2014-12-04 DIAGNOSIS — Y9389 Activity, other specified: Secondary | ICD-10-CM | POA: Diagnosis not present

## 2014-12-04 DIAGNOSIS — Y998 Other external cause status: Secondary | ICD-10-CM | POA: Insufficient documentation

## 2014-12-04 DIAGNOSIS — Y9259 Other trade areas as the place of occurrence of the external cause: Secondary | ICD-10-CM | POA: Insufficient documentation

## 2014-12-04 DIAGNOSIS — Z79899 Other long term (current) drug therapy: Secondary | ICD-10-CM | POA: Diagnosis not present

## 2014-12-04 DIAGNOSIS — W01198A Fall on same level from slipping, tripping and stumbling with subsequent striking against other object, initial encounter: Secondary | ICD-10-CM | POA: Insufficient documentation

## 2014-12-04 DIAGNOSIS — S0083XA Contusion of other part of head, initial encounter: Secondary | ICD-10-CM | POA: Insufficient documentation

## 2014-12-04 DIAGNOSIS — E119 Type 2 diabetes mellitus without complications: Secondary | ICD-10-CM | POA: Diagnosis not present

## 2014-12-04 DIAGNOSIS — Z7982 Long term (current) use of aspirin: Secondary | ICD-10-CM | POA: Diagnosis not present

## 2014-12-04 DIAGNOSIS — I10 Essential (primary) hypertension: Secondary | ICD-10-CM | POA: Insufficient documentation

## 2014-12-04 DIAGNOSIS — S0990XA Unspecified injury of head, initial encounter: Secondary | ICD-10-CM | POA: Diagnosis present

## 2014-12-04 NOTE — Discharge Instructions (Signed)
Contusion °A contusion is a deep bruise. Contusions are the result of an injury that caused bleeding under the skin. The contusion may turn blue, purple, or yellow. Minor injuries will give you a painless contusion, but more severe contusions may stay painful and swollen for a few weeks.  °CAUSES  °A contusion is usually caused by a blow, trauma, or direct force to an area of the body. °SYMPTOMS  °· Swelling and redness of the injured area. °· Bruising of the injured area. °· Tenderness and soreness of the injured area. °· Pain. °DIAGNOSIS  °The diagnosis can be made by taking a history and physical exam. An X-ray, CT scan, or MRI may be needed to determine if there were any associated injuries, such as fractures. °TREATMENT  °Specific treatment will depend on what area of the body was injured. In general, the best treatment for a contusion is resting, icing, elevating, and applying cold compresses to the injured area. Over-the-counter medicines may also be recommended for pain control. Ask your caregiver what the best treatment is for your contusion. °HOME CARE INSTRUCTIONS  °· Put ice on the injured area. °¨ Put ice in a plastic bag. °¨ Place a towel between your skin and the bag. °¨ Leave the ice on for 15-20 minutes, 3-4 times a day, or as directed by your health care provider. °· Only take over-the-counter or prescription medicines for pain, discomfort, or fever as directed by your caregiver. Your caregiver may recommend avoiding anti-inflammatory medicines (aspirin, ibuprofen, and naproxen) for 48 hours because these medicines may increase bruising. °· Rest the injured area. °· If possible, elevate the injured area to reduce swelling. °SEEK IMMEDIATE MEDICAL CARE IF:  °· You have increased bruising or swelling. °· You have pain that is getting worse. °· Your swelling or pain is not relieved with medicines. °MAKE SURE YOU:  °· Understand these instructions. °· Will watch your condition. °· Will get help right  away if you are not doing well or get worse. °Document Released: 03/13/2005 Document Revised: 06/08/2013 Document Reviewed: 04/08/2011 °ExitCare® Patient Information ©2015 [employer]. This information is not intended to replace advice given to you by your health care provider. Make sure you discuss any questions you have with your health care provider. ° °

## 2014-12-04 NOTE — ED Provider Notes (Signed)
CSN: [id number]     Arrival date & time 12/04/14  1806 History   First MD Initiated Contact with Patient 12/04/14 1820     Chief Complaint  Patient presents with  . Head Injury     (Consider location/radiation/quality/duration/timing/severity/associated sxs/prior Treatment) HPI  Janet Short is a 64 y.o. female who is here for evaluation of an injury to her face. She states that she was reaching for something on a shelf when a steel toe boot fell and struck her forehead. She has pain in the area where it struck the right side of her forehead and denies other injury. There was no loss of consciousness, blurred vision, nausea, vomiting, weakness or dizziness. There are no other known modifying factors.     Past Medical History  Diagnosis Date  . Diabetes mellitus   . Hypertension    Past Surgical History  Procedure Laterality Date  . Abdominal hysterectomy    . Colonoscopy N/A 05/19/2014    Procedure: COLONOSCOPY;  Surgeon: Rogene Houston, MD;  Location: [facility];  Service: Endoscopy;  Laterality: N/A;  730 - moved to 12/3 @ 10:30 Ann to notify pt   History reviewed. No pertinent family history. History  Substance Use Topics  . Smoking status: Never Smoker   . Smokeless tobacco: Not on file  . Alcohol Use: Yes     Comment: rare   OB History    No data available     Review of Systems  All other systems reviewed and are negative.     Allergies  Review of patient's allergies indicates no known allergies.  Home Medications   Prior to Admission medications   Medication Sig Start Date End Date Taking? Authorizing Provider  aspirin EC 81 MG tablet Take 81 mg by mouth daily.    Historical Provider, MD  bisoprolol-hydrochlorothiazide [facility]) 10-6.25 MG per tablet Take 1 tablet by mouth daily. 11/06/13   Evalee Jefferson, PA-C  Cholecalciferol (VITAMIN D PO) Take 1 tablet by mouth daily.    Historical Provider, MD  lisinopril (PRINIVIL,ZESTRIL) 20 MG tablet Take 1 tablet  (20 mg total) by mouth daily. 11/06/13   Evalee Jefferson, PA-C  metFORMIN (GLUCOPHAGE) 500 MG tablet Take 1 tablet (500 mg total) by mouth 2 (two) times daily with a meal. 11/06/13   Evalee Jefferson, PA-C   BP 155/62 mmHg  Pulse 60  Temp(Src) 98.8 F (37.1 C) (Oral)  Resp 18  Ht 5\' 1"  (1.549 m)  Wt 180 lb (81.647 kg)  BMI 34.03 kg/m2  SpO2 99% Physical Exam  Constitutional: She is oriented to person, place, and time. She appears well-developed and well-nourished.  HENT:  Head: Normocephalic.  Right Ear: External ear normal.  Left Ear: External ear normal.  Very small contusion right forehead, without associated skin injury. No associated crepitation.  Eyes: Conjunctivae and EOM are normal. Pupils are equal, round, and reactive to light.  Neck: Normal range of motion and phonation normal. Neck supple.  Cardiovascular: Normal rate.   Pulmonary/Chest: Effort normal. She exhibits no bony tenderness.  Musculoskeletal: Normal range of motion.  Neurological: She is alert and oriented to person, place, and time. No cranial nerve deficit or sensory deficit. She exhibits normal muscle tone. Coordination normal.  Skin: Skin is warm, dry and intact.  Psychiatric: She has a normal mood and affect. Her behavior is normal. Judgment and thought content normal.  Nursing note and vitals reviewed.   ED Course  Procedures (including critical care time)  Findings  discussed with patient, all questions answered.  Labs Review Labs Reviewed - No data to display  Imaging Review No results found.   EKG Interpretation None      MDM   Final diagnoses:  Contusion of face, initial encounter    Minor contusion face, doubt serious head injury or fractures.  Nursing Notes Reviewed/ Care Coordinated Applicable Imaging Reviewed Interpretation of Laboratory Data incorporated into ED treatment  The patient appears reasonably screened and/or stabilized for discharge and I doubt any other medical condition or  other [facility] requiring further screening, evaluation, or treatment in the ED at this time prior to discharge.  Plan: Home Medications- none; Home Treatments- ice prn swelling; return here if the recommended treatment, does not improve the symptoms; Recommended follow up- PCP prn     Daleen Bo, MD 12/04/14 1824

## 2014-12-04 NOTE — ED Notes (Addendum)
Pt states a boot fell and hit her in the head at [employer] 20 minutes ago, states "it hurts a little". Pt has small raised area on forehead.

## 2015-02-13 ENCOUNTER — Other Ambulatory Visit (HOSPITAL_COMMUNITY): Payer: Self-pay | Admitting: Internal Medicine

## 2015-02-13 DIAGNOSIS — Z1231 Encounter for screening mammogram for malignant neoplasm of breast: Secondary | ICD-10-CM

## 2015-02-15 ENCOUNTER — Ambulatory Visit (HOSPITAL_COMMUNITY)
Admission: RE | Admit: 2015-02-15 | Discharge: 2015-02-15 | Disposition: A | Discharge status: Home / Self Care | Payer: Commercial Managed Care - HMO | Source: Ambulatory Visit | Attending: Internal Medicine | Admitting: Internal Medicine

## 2015-02-15 DIAGNOSIS — Z1231 Encounter for screening mammogram for malignant neoplasm of breast: Secondary | ICD-10-CM | POA: Diagnosis not present

## 2015-03-11 ENCOUNTER — Emergency Department (HOSPITAL_COMMUNITY): Payer: Commercial Managed Care - HMO

## 2015-03-11 ENCOUNTER — Encounter (HOSPITAL_COMMUNITY): Payer: Self-pay | Admitting: *Deleted

## 2015-03-11 ENCOUNTER — Emergency Department (HOSPITAL_COMMUNITY)
Admission: EM | Admit: 2015-03-11 | Discharge: 2015-03-11 | Disposition: A | Discharge status: Home / Self Care | Payer: Commercial Managed Care - HMO | Attending: Emergency Medicine | Admitting: Emergency Medicine

## 2015-03-11 DIAGNOSIS — Z79899 Other long term (current) drug therapy: Secondary | ICD-10-CM | POA: Diagnosis not present

## 2015-03-11 DIAGNOSIS — M79606 Pain in leg, unspecified: Secondary | ICD-10-CM | POA: Diagnosis present

## 2015-03-11 DIAGNOSIS — M79672 Pain in left foot: Secondary | ICD-10-CM | POA: Insufficient documentation

## 2015-03-11 DIAGNOSIS — E119 Type 2 diabetes mellitus without complications: Secondary | ICD-10-CM | POA: Diagnosis not present

## 2015-03-11 DIAGNOSIS — Z7982 Long term (current) use of aspirin: Secondary | ICD-10-CM | POA: Insufficient documentation

## 2015-03-11 DIAGNOSIS — I10 Essential (primary) hypertension: Secondary | ICD-10-CM | POA: Insufficient documentation

## 2015-03-11 MED ORDER — MELOXICAM 7.5 MG PO TABS: 7.5000 mg | ORAL_TABLET | Freq: Every day | ORAL | Status: DC

## 2015-03-11 NOTE — ED Notes (Signed)
Pain to right heel x 2 weeks. No known injury.

## 2015-03-11 NOTE — ED Provider Notes (Signed)
CSN: [id number]     Arrival date & time 03/11/15  1311 History   First MD Initiated Contact with Patient 03/11/15 1332     Chief Complaint  Patient presents with  . Foot Pain     (Consider location/radiation/quality/duration/timing/severity/associated sxs/prior Treatment) Patient is a 64 y.o. female presenting with lower extremity pain. The history is provided by the patient.  Foot Pain This is a new problem. The current episode started more than 1 month ago. The problem occurs constantly. The problem has been gradually worsening. Associated symptoms include myalgias. Pertinent negatives include no joint swelling. Nothing aggravates the symptoms. She has tried nothing for the symptoms. The treatment provided moderate relief.    Past Medical History  Diagnosis Date  . Diabetes mellitus   . Hypertension    Past Surgical History  Procedure Laterality Date  . Abdominal hysterectomy    . Colonoscopy N/A 05/19/2014    Procedure: COLONOSCOPY;  Surgeon: Rogene Houston, MD;  Location: [facility];  Service: Endoscopy;  Laterality: N/A;  730 - moved to 12/3 @ 10:30 Ann to notify pt   No family history on file. Social History  Substance Use Topics  . Smoking status: Never Smoker   . Smokeless tobacco: None  . Alcohol Use: Yes     Comment: rare   OB History    No data available     Review of Systems  Musculoskeletal: Positive for myalgias. Negative for joint swelling.  All other systems reviewed and are negative.     Allergies  Review of patient's allergies indicates no known allergies.  Home Medications   Prior to Admission medications   Medication Sig Start Date End Date Taking? Authorizing Provider  amLODipine (NORVASC) 2.5 MG tablet Take 2.5 mg by mouth daily.   Yes Historical Provider, MD  aspirin EC 81 MG tablet Take 81 mg by mouth daily.   Yes Historical Provider, MD  bisoprolol-hydrochlorothiazide (ZIAC) 10-6.25 MG per tablet Take 1 tablet by mouth daily. 11/06/13   Yes Evalee Jefferson, PA-C  Cholecalciferol (VITAMIN D PO) Take 1 tablet by mouth daily.   Yes Historical Provider, MD  lisinopril (PRINIVIL,ZESTRIL) 20 MG tablet Take 1 tablet (20 mg total) by mouth daily. 11/06/13  Yes Evalee Jefferson, PA-C  metFORMIN (GLUCOPHAGE) 500 MG tablet Take 1 tablet (500 mg total) by mouth 2 (two) times daily with a meal. 11/06/13  Yes Evalee Jefferson, PA-C   BP 146/71 mmHg  Pulse 58  Temp(Src) 98 F (36.7 C) (Oral)  Resp 16  Ht 5\' 1"  (1.549 m)  Wt 190 lb (86.183 kg)  BMI 35.92 kg/m2  SpO2 100% Physical Exam  Constitutional: She appears well-developed and well-nourished.  HENT:  Head: Normocephalic.  Musculoskeletal: She exhibits tenderness.  Tender mid foot and posterior heel,  No swelling, no deformity nv and ns intact  Neurological: She is alert.  Skin: Skin is warm.  Psychiatric: She has a normal mood and affect.  Vitals reviewed.   ED Course  Procedures (including critical care time) Labs Review Labs Reviewed - No data to display  Imaging Review Dg Foot Complete Right  03/11/2015   CLINICAL DATA:  Posterior heel pain x2 weeks  EXAM: RIGHT FOOT COMPLETE - 3+ VIEW  COMPARISON:  None.  FINDINGS: No fracture or dislocation is seen.  Mild degenerative changes of the 1st MTP joint. Mild degenerative changes of the dorsal midfoot.  Moderate plantar and posterior calcaneal enthesophytes.  The visualized soft tissues are unremarkable.  IMPRESSION: No fracture  or dislocation is seen.  Mild degenerative changes.   Electronically Signed   By: Julian Hy M.D.   On: 03/11/2015 13:56   I have personally reviewed and evaluated these images and lab results as part of my medical decision-making.   EKG Interpretation None      MDM   Final diagnoses:  Left foot pain    mobic Post op shoe     Fransico Meadow, PA-C 03/11/15 1429  Ripley Fraise, MD 03/11/15 1446

## 2015-03-11 NOTE — Discharge Instructions (Signed)
Plantar Fasciitis  Plantar fasciitis is a common condition that causes foot pain. It is soreness (inflammation) of the band of tough fibrous tissue on the bottom of the foot that runs from the heel bone (calcaneus) to the ball of the foot. The cause of this soreness may be from excessive standing, poor fitting shoes, running on hard surfaces, being overweight, having an abnormal walk, or overuse (this is common in runners) of the painful foot or feet. It is also common in aerobic exercise dancers and ballet dancers.  SYMPTOMS   Most people with plantar fasciitis complain of:   Severe pain in the morning on the bottom of their foot especially when taking the first steps out of bed. This pain recedes after a few minutes of walking.   Severe pain is experienced also during walking following a long period of inactivity.   Pain is worse when walking barefoot or up stairs  DIAGNOSIS    Your caregiver will diagnose this condition by examining and feeling your foot.   Special tests such as X-rays of your foot, are usually not needed.  PREVENTION    Consult a sports medicine professional before beginning a new exercise program.   Walking programs offer a good workout. With walking there is a lower chance of overuse injuries common to runners. There is less impact and less jarring of the joints.   Begin all new exercise programs slowly. If problems or pain develop, decrease the amount of time or distance until you are at a comfortable level.   Wear good shoes and replace them regularly.   Stretch your foot and the heel cords at the back of the ankle (Achilles tendon) both before and after exercise.   Run or exercise on even surfaces that are not hard. For example, asphalt is better than pavement.   Do not run barefoot on hard surfaces.   If using a treadmill, vary the incline.   Do not continue to workout if you have foot or joint problems. Seek professional help if they do not improve.  HOME CARE INSTRUCTIONS     Avoid activities that cause you pain until you recover.   Use ice or cold packs on the problem or painful areas after working out.   Only take over-the-counter or prescription medicines for pain, discomfort, or fever as directed by your caregiver.   Soft shoe inserts or athletic shoes with air or gel sole cushions may be helpful.   If problems continue or become more severe, consult a sports medicine caregiver or your own health care provider. Cortisone is a potent anti-inflammatory medication that may be injected into the painful area. You can discuss this treatment with your caregiver.  MAKE SURE YOU:    Understand these instructions.   Will watch your condition.   Will get help right away if you are not doing well or get worse.  Document Released: 02/26/2001 Document Revised: 08/26/2011 Document Reviewed: 04/27/2008  [employer] Patient Information 2015 [employer]. This information is not intended to replace advice given to you by your health care provider. Make sure you discuss any questions you have with your health care provider.

## 2016-04-11 ENCOUNTER — Other Ambulatory Visit (HOSPITAL_COMMUNITY): Payer: Self-pay | Admitting: Family Medicine

## 2016-04-11 DIAGNOSIS — Z1231 Encounter for screening mammogram for malignant neoplasm of breast: Secondary | ICD-10-CM

## 2016-05-01 ENCOUNTER — Ambulatory Visit (HOSPITAL_COMMUNITY): Payer: Commercial Managed Care - HMO

## 2016-05-22 ENCOUNTER — Ambulatory Visit (HOSPITAL_COMMUNITY)
Admission: RE | Admit: 2016-05-22 | Discharge: 2016-05-22 | Disposition: A | Discharge status: Home / Self Care | Payer: Medicare Other | Source: Ambulatory Visit | Attending: Family Medicine | Admitting: Family Medicine

## 2016-05-22 DIAGNOSIS — Z1231 Encounter for screening mammogram for malignant neoplasm of breast: Secondary | ICD-10-CM | POA: Diagnosis not present

## 2016-08-11 IMAGING — DX DG FOOT COMPLETE 3+V*R*
3 series · 3 of 3 positions shown · non-contrast
Comparison: None.

CLINICAL DATA: Posterior heel pain x2 weeks

EXAM:
RIGHT FOOT COMPLETE - 3+ VIEW

[foot ap]
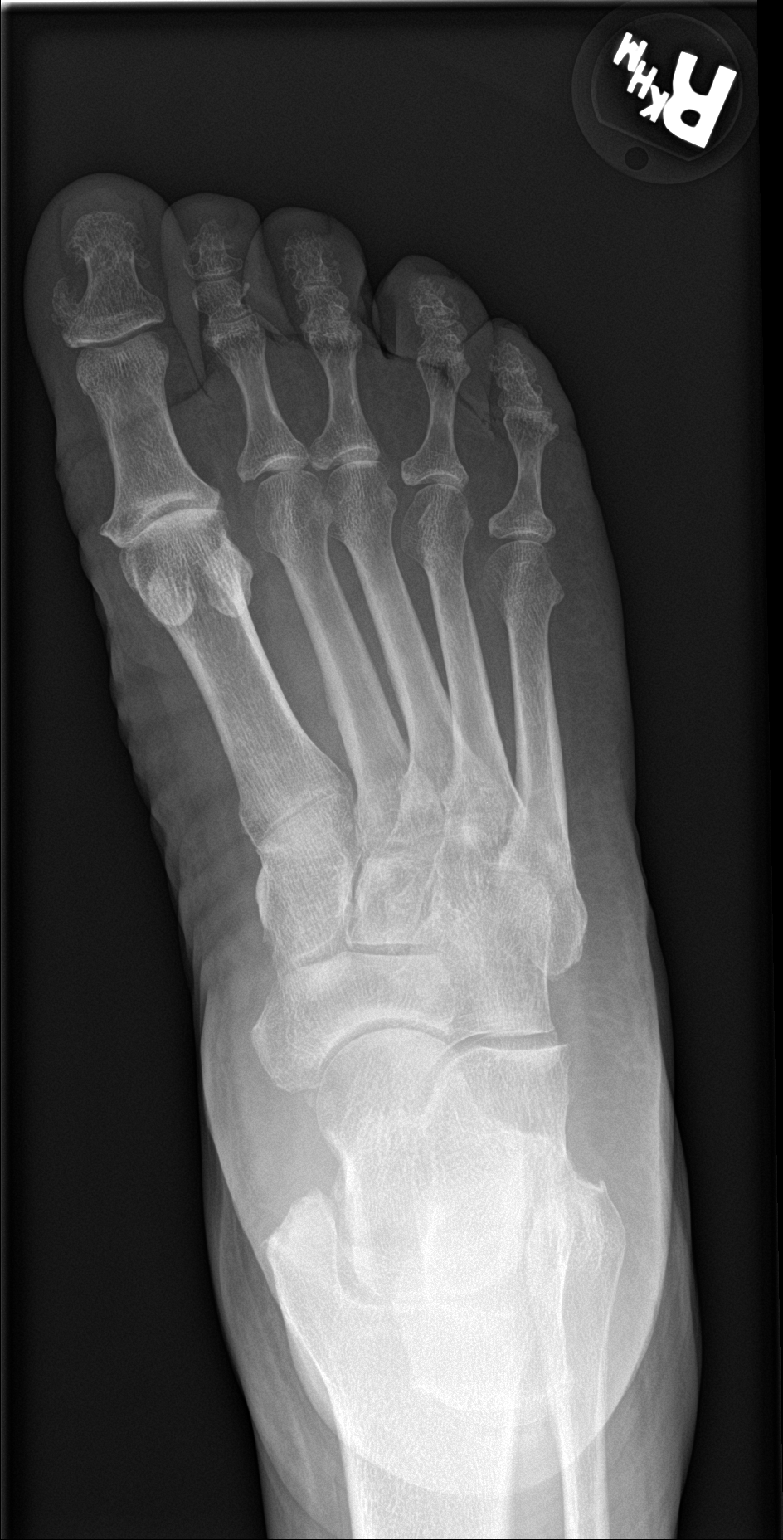

[foot obl]
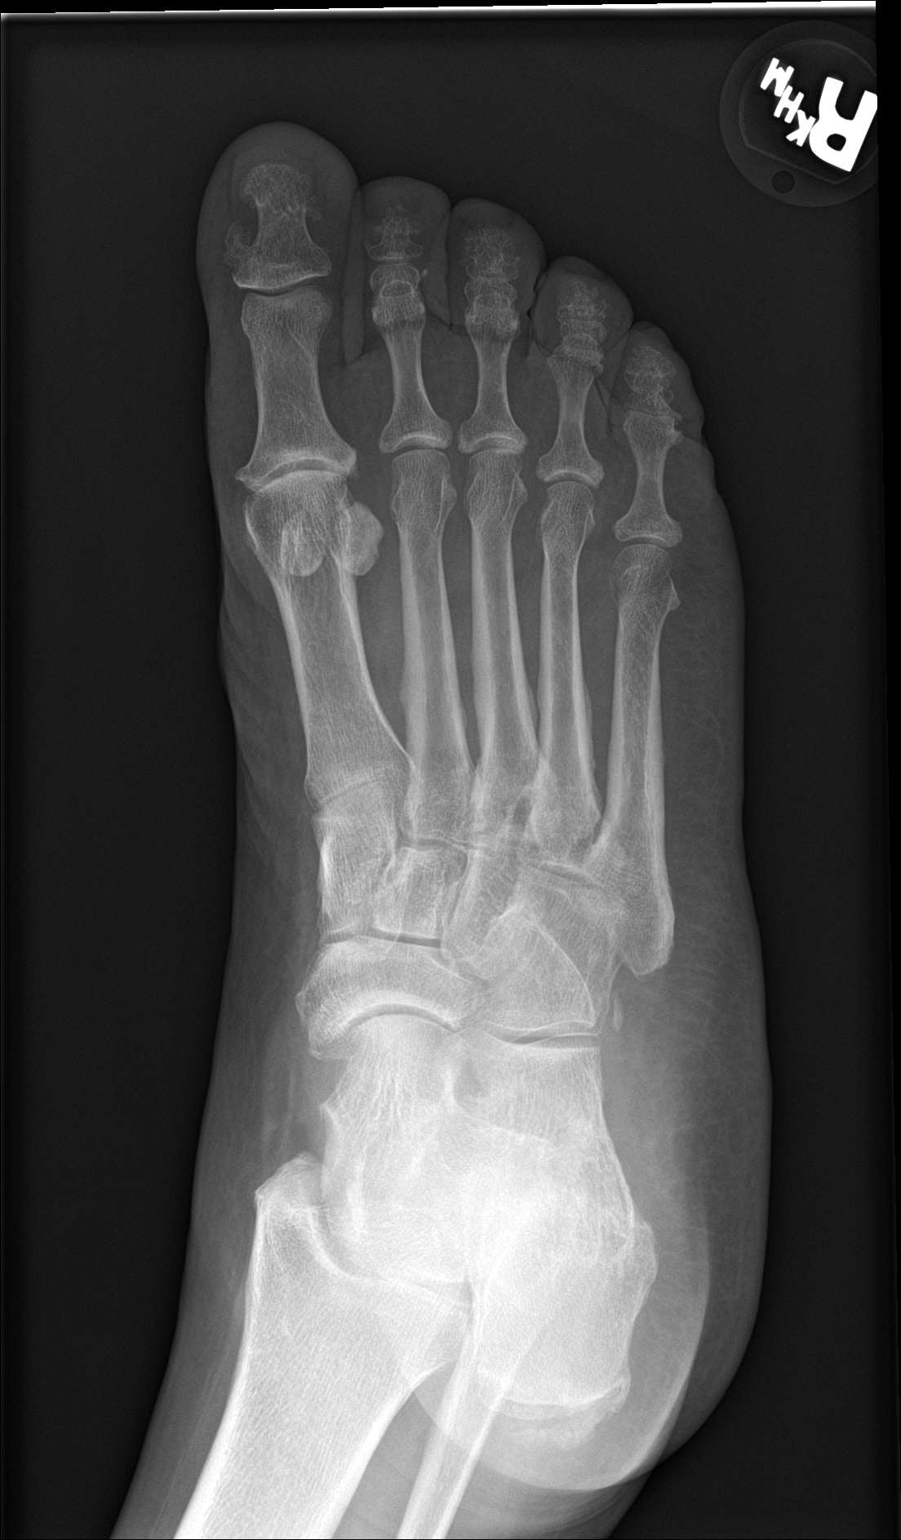

[foot lat]
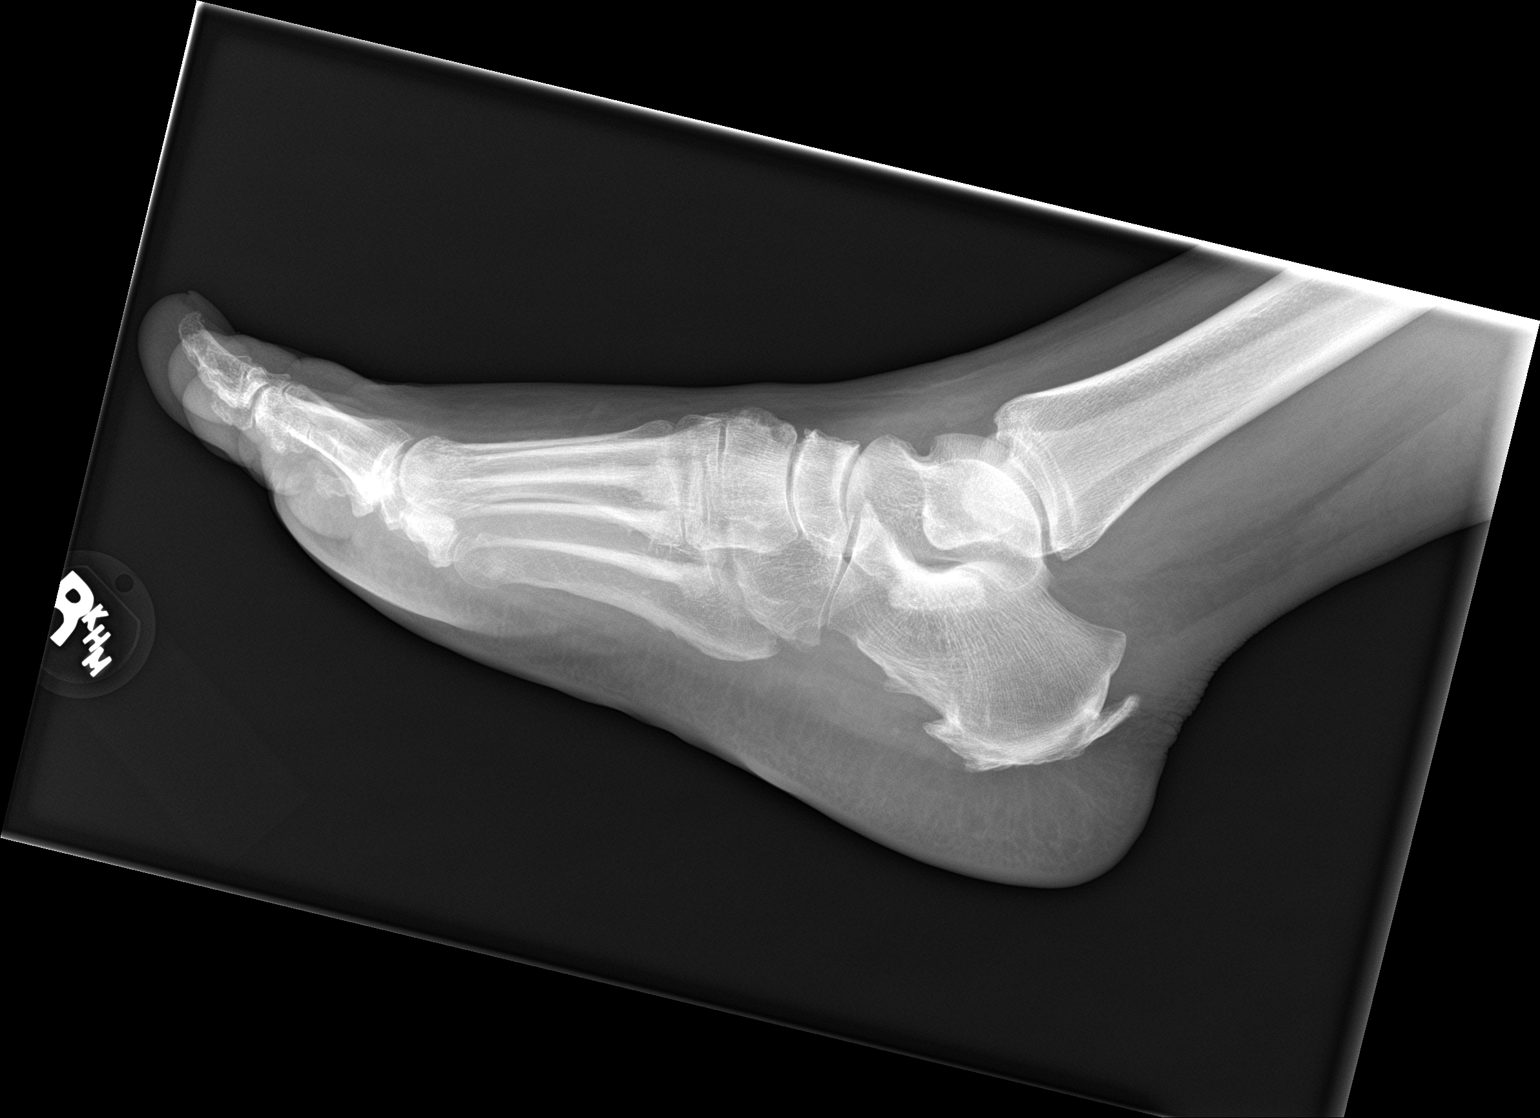

[3 of 3 positions shown; findings below may reference images not displayed]

FINDINGS: No fracture or dislocation is seen.

Mild degenerative changes of the 1st MTP joint. Mild degenerative
changes of the dorsal midfoot.

Moderate plantar and posterior calcaneal enthesophytes.

The visualized soft tissues are unremarkable.
IMPRESSION: No fracture or dislocation is seen.

Mild degenerative changes.

## 2017-06-02 ENCOUNTER — Other Ambulatory Visit (HOSPITAL_COMMUNITY): Payer: Self-pay | Admitting: Family Medicine

## 2017-06-02 DIAGNOSIS — Z1231 Encounter for screening mammogram for malignant neoplasm of breast: Secondary | ICD-10-CM

## 2017-06-06 ENCOUNTER — Ambulatory Visit (HOSPITAL_COMMUNITY)
Admission: RE | Admit: 2017-06-06 | Discharge: 2017-06-06 | Disposition: A | Discharge status: Home / Self Care | Payer: Medicare Other | Source: Ambulatory Visit | Attending: Family Medicine | Admitting: Family Medicine

## 2017-06-06 ENCOUNTER — Encounter (HOSPITAL_COMMUNITY): Payer: Self-pay

## 2017-06-06 DIAGNOSIS — Z1231 Encounter for screening mammogram for malignant neoplasm of breast: Secondary | ICD-10-CM | POA: Insufficient documentation

## 2017-06-13 ENCOUNTER — Other Ambulatory Visit: Payer: Self-pay

## 2017-06-13 ENCOUNTER — Encounter (HOSPITAL_COMMUNITY): Payer: Self-pay | Admitting: Emergency Medicine

## 2017-06-13 ENCOUNTER — Emergency Department (HOSPITAL_COMMUNITY)
Admission: EM | Admit: 2017-06-13 | Discharge: 2017-06-13 | Disposition: A | Discharge status: Home / Self Care | Payer: Medicare Other | Attending: Emergency Medicine | Admitting: Emergency Medicine

## 2017-06-13 DIAGNOSIS — Z7982 Long term (current) use of aspirin: Secondary | ICD-10-CM | POA: Diagnosis not present

## 2017-06-13 DIAGNOSIS — H9201 Otalgia, right ear: Secondary | ICD-10-CM | POA: Diagnosis present

## 2017-06-13 DIAGNOSIS — Z79899 Other long term (current) drug therapy: Secondary | ICD-10-CM | POA: Insufficient documentation

## 2017-06-13 DIAGNOSIS — Z7984 Long term (current) use of oral hypoglycemic drugs: Secondary | ICD-10-CM | POA: Diagnosis not present

## 2017-06-13 DIAGNOSIS — H6691 Otitis media, unspecified, right ear: Secondary | ICD-10-CM | POA: Diagnosis not present

## 2017-06-13 DIAGNOSIS — H6690 Otitis media, unspecified, unspecified ear: Secondary | ICD-10-CM

## 2017-06-13 DIAGNOSIS — I10 Essential (primary) hypertension: Secondary | ICD-10-CM | POA: Diagnosis not present

## 2017-06-13 DIAGNOSIS — E119 Type 2 diabetes mellitus without complications: Secondary | ICD-10-CM | POA: Insufficient documentation

## 2017-06-13 MED ORDER — AMOXICILLIN 500 MG PO CAPS: 500.0000 mg | ORAL_CAPSULE | Freq: Three times a day (TID) | ORAL | 0 refills | Status: DC

## 2017-06-13 NOTE — Discharge Instructions (Signed)
Tylenol every 4 hours if needed for pain or fever.  Follow-up with your primary doctor or return here for any worsening symptoms.

## 2017-06-13 NOTE — ED Provider Notes (Signed)
[facility] EMERGENCY DEPARTMENT Provider Note   CSN: [id number] Arrival date & time: 06/13/17  1448     History   Chief Complaint Chief Complaint  Patient presents with  . Otalgia    HPI Janet Short is a 66 y.o. female.  HPI   Janet Short is a 66 y.o. female who presents to the Emergency Department complaining of right ear pain for 3-4 days.  She describes a constant pain and pressure sensation to her right ear.  She states the pain was consistent last evening and kept her from sleeping.  She has tried over-the-counter medications without relief.  She also reports cold type symptoms including nasal congestion and cough.  She denies known fever.  She also denies headache, dizziness, nausea or vomiting.   Past Medical History:  Diagnosis Date  . Diabetes mellitus   . Hypertension     There are no active problems to display for this patient.   Past Surgical History:  Procedure Laterality Date  . ABDOMINAL HYSTERECTOMY    . COLONOSCOPY N/A 05/19/2014   Procedure: COLONOSCOPY;  Surgeon: Rogene Houston, MD;  Location: [facility];  Service: Endoscopy;  Laterality: N/A;  730 - moved to 12/3 @ 10:30 Ann to notify pt    OB History    No data available       Home Medications    Prior to Admission medications   Medication Sig Start Date End Date Taking? Authorizing Provider  amLODipine (NORVASC) 2.5 MG tablet Take 2.5 mg by mouth daily.    [provider]  aspirin EC 81 MG tablet Take 81 mg by mouth daily.    [provider]  bisoprolol-hydrochlorothiazide (ZIAC) 10-6.25 MG per tablet Take 1 tablet by mouth daily. 11/06/13   Evalee Jefferson, PA-C  Cholecalciferol (VITAMIN D PO) Take 1 tablet by mouth daily.    [provider]  lisinopril (PRINIVIL,ZESTRIL) 20 MG tablet Take 1 tablet (20 mg total) by mouth daily. 11/06/13   Evalee Jefferson, PA-C  meloxicam (MOBIC) 7.5 MG tablet Take 1 tablet (7.5 mg total) by mouth daily. 03/11/15   Fransico Meadow, PA-C  metFORMIN (GLUCOPHAGE) 500 MG tablet Take 1 tablet (500 mg total) by mouth 2 (two) times daily with a meal. 11/06/13   Idol, Almyra Free, PA-C    Family History No family history on file.  Social History Social History   Tobacco Use  . Smoking status: Never Smoker  . Smokeless tobacco: Never Used  Substance Use Topics  . Alcohol use: Yes    Comment: rare  . Drug use: No     Allergies   Patient has no known allergies.   Review of Systems Review of Systems  Constitutional: Negative for activity change, appetite change, chills and fever.  HENT: Positive for congestion, ear pain and sneezing. Negative for facial swelling, hearing loss, rhinorrhea, sore throat and trouble swallowing.   Eyes: Negative for visual disturbance.  Respiratory: Positive for cough. Negative for chest tightness, shortness of breath, wheezing and stridor.   Gastrointestinal: Negative for nausea and vomiting.  Musculoskeletal: Negative for neck pain and neck stiffness.  Skin: Negative for rash.  Neurological: Negative for dizziness, syncope, facial asymmetry, weakness, light-headedness, numbness and headaches.  Hematological: Negative for adenopathy.  Psychiatric/Behavioral: Negative for confusion.  All other systems reviewed and are negative.    Physical Exam Updated Vital Signs BP (!) 171/87 (BP Location: Right Arm)   Pulse 72   Temp 98.5 F (36.9 C) (  Oral)   Resp 18   Ht 5\' 1"  (1.549 m)   Wt 79.8 kg (176 lb)   SpO2 98%   BMI 33.25 kg/m   Physical Exam  Constitutional: She is oriented to person, place, and time. She appears well-developed and well-nourished. No distress.  HENT:  Head: Normocephalic and atraumatic.  Right Ear: Ear canal normal. There is tenderness. A middle ear effusion is present.  Left Ear: Tympanic membrane and ear canal normal.  Nose: Mucosal edema present. No rhinorrhea.  Mouth/Throat: Uvula is midline and mucous membranes are normal. No trismus in the jaw.  No uvula swelling. No oropharyngeal exudate, posterior oropharyngeal edema, posterior oropharyngeal erythema or tonsillar abscesses.  Dullness and mild bulging to the right tympanic membrane.  Loss of landmarks present.  No erythema.  No mastoid tenderness.  Eyes: Conjunctivae are normal.  Neck: Normal range of motion and phonation normal. Neck supple. No Brudzinski's sign and no Kernig's sign noted.  Cardiovascular: Normal rate, regular rhythm and intact distal pulses.  No murmur heard. Pulmonary/Chest: Effort normal and breath sounds normal. No respiratory distress. She has no wheezes. She has no rales.  Abdominal: Soft. She exhibits no distension. There is no tenderness. There is no rebound and no guarding.  Musculoskeletal: She exhibits no edema.  Lymphadenopathy:    She has no cervical adenopathy.  Neurological: She is alert and oriented to person, place, and time. She exhibits normal muscle tone. Coordination normal.  Skin: Skin is warm and dry.  Nursing note and vitals reviewed.    ED Treatments / Results  Labs (all labs ordered are listed, but only abnormal results are displayed) Labs Reviewed - No data to display  EKG  EKG Interpretation None       Radiology No results found.  Procedures Procedures (including critical care time)  Medications Ordered in ED Medications - No data to display   Initial Impression / Assessment and Plan / ED Course  I have reviewed the triage vital signs and the nursing notes.  Pertinent labs & imaging results that were available during my care of the patient were reviewed by me and considered in my medical decision making (see chart for details).     Patient with likely otitis media.  Patient is otherwise well-appearing.  Ambulates with a steady gait.  No focal neuro deficits on exam.  Patient appears stable for discharge home.  Prescription for amoxicillin she agrees to close PCP follow-up and return precautions were  discussed.  Final Clinical Impressions(s) / ED Diagnoses   Final diagnoses:  Acute otitis media, unspecified otitis media type    ED Discharge Orders    None       Bufford Lope 06/13/17 1527    Noemi Chapel, MD 06/14/17 1546

## 2017-06-13 NOTE — ED Triage Notes (Signed)
Rt ear pain x 3-4 days

## 2018-02-20 ENCOUNTER — Emergency Department (HOSPITAL_COMMUNITY)
Admission: EM | Admit: 2018-02-20 | Discharge: 2018-02-20 | Disposition: A | Discharge status: Home / Self Care | Payer: Medicare Other | Attending: Emergency Medicine | Admitting: Emergency Medicine

## 2018-02-20 ENCOUNTER — Encounter (HOSPITAL_COMMUNITY): Payer: Self-pay | Admitting: Emergency Medicine

## 2018-02-20 ENCOUNTER — Other Ambulatory Visit: Payer: Self-pay

## 2018-02-20 DIAGNOSIS — K91840 Postprocedural hemorrhage and hematoma of a digestive system organ or structure following a digestive system procedure: Secondary | ICD-10-CM | POA: Insufficient documentation

## 2018-02-20 DIAGNOSIS — Z7984 Long term (current) use of oral hypoglycemic drugs: Secondary | ICD-10-CM | POA: Diagnosis not present

## 2018-02-20 DIAGNOSIS — Z79899 Other long term (current) drug therapy: Secondary | ICD-10-CM | POA: Insufficient documentation

## 2018-02-20 DIAGNOSIS — I10 Essential (primary) hypertension: Secondary | ICD-10-CM | POA: Insufficient documentation

## 2018-02-20 DIAGNOSIS — K1379 Other lesions of oral mucosa: Secondary | ICD-10-CM

## 2018-02-20 DIAGNOSIS — E119 Type 2 diabetes mellitus without complications: Secondary | ICD-10-CM | POA: Diagnosis not present

## 2018-02-20 DIAGNOSIS — Z7982 Long term (current) use of aspirin: Secondary | ICD-10-CM | POA: Insufficient documentation

## 2018-02-20 LAB — BASIC METABOLIC PANEL
Anion gap: 11 (ref 5–15)
BUN: 9 mg/dL (ref 8–23)
CO2: 28 mmol/L (ref 22–32)
Calcium: 9.3 mg/dL (ref 8.9–10.3)
Chloride: 102 mmol/L (ref 98–111)
Creatinine, Ser: 0.95 mg/dL (ref 0.44–1.00)
GFR calc Af Amer: >60 mL/min (ref >60)
Glucose, Bld: 126 mg/dL — ABNORMAL HIGH (ref 70–99)
POTASSIUM: 3.8 mmol/L (ref 3.5–5.1)
Sodium: 141 mmol/L (ref 135–145)

## 2018-02-20 LAB — CBC WITH DIFFERENTIAL/PLATELET
Basophils Absolute: 0 10*3/uL (ref 0.0–0.1)
Basophils Relative: 0 %
EOS ABS: 0.2 10*3/uL (ref 0.0–0.7)
Eosinophils Relative: 2 %
HCT: 39.7 % (ref 36.0–46.0)
Hemoglobin: 13.3 g/dL (ref 12.0–15.0)
LYMPHS ABS: 1.9 10*3/uL (ref 0.7–4.0)
Lymphocytes Relative: 21 %
MCH: 27.9 pg (ref 26.0–34.0)
MCHC: 33.5 g/dL (ref 30.0–36.0)
MCV: 83.4 fL (ref 78.0–100.0)
MONOS PCT: 6 %
Monocytes Absolute: 0.5 10*3/uL (ref 0.1–1.0)
Neutro Abs: 6.4 10*3/uL (ref 1.7–7.7)
Neutrophils Relative %: 71 %
Platelets: 214 10*3/uL (ref 150–400)
RBC: 4.76 MIL/uL (ref 3.87–5.11)
RDW: 13.9 % (ref 11.5–15.5)
WBC: 8.9 10*3/uL (ref 4.0–10.5)

## 2018-02-20 LAB — APTT: aPTT: 28 seconds (ref 24–36)

## 2018-02-20 LAB — PROTIME-INR
INR: 0.97
Prothrombin Time: 12.8 seconds (ref 11.4–15.2)

## 2018-02-20 MED ORDER — TRANEXAMIC ACID 1000 MG/10ML IV SOLN
1000.0000 mg | INTRAVENOUS | Status: AC
  Administered 2018-02-20: 1000 mg via INTRAVENOUS
  Filled 2018-02-20: qty 10

## 2018-02-20 MED ORDER — LIDOCAINE-EPINEPHRINE (PF) 2 %-1:200000 IJ SOLN
INTRAMUSCULAR | Status: AC
  Administered 2018-02-20: 20:00:00
  Filled 2018-02-20: qty 20

## 2018-02-20 MED ORDER — TRANEXAMIC ACID 1000 MG/10ML IV SOLN
500.0000 mg | Freq: Once | INTRAVENOUS | Status: AC
  Administered 2018-02-20: 500 mg via TOPICAL
  Filled 2018-02-20 (×2): qty 10

## 2018-02-20 NOTE — ED Provider Notes (Signed)
[facility] Provider Note   CSN: [id number] Arrival date & time: 02/20/18  1612     History   Chief Complaint Chief Complaint  Patient presents with  . Coagulation Disorder    HPI Janet Short is a 67 y.o. female.  HPI  The pt is a 67 y/o female.  She presents to the hospital today with some uncontrolled bleeding that occurred after she had multiple teeth pulled at a dentist office today, the dentist was Dr. Tamala Julian in the [place] [facility].  The patient reportedly had 7 teeth that were pulled, she is on a baby aspirin, she was told that if the bleeding was uncontrolled she should go to the emergency department, she was told not to come back to the office according to the patient's report.  The patient denies having coughing shortness of breath or chest pain, she has no lightheadedness, the bleeding is been persistent especially on the right side of her mouth.  She has some post extraction tenderness but not as unexpected.  The patient denies any other anticoagulants, she did require a blood transfusion many years ago but she cannot remember why.  Her symptoms are persistent, nothing seems to make it better, she is soaking through gauze.  Past Medical History:  Diagnosis Date  . Diabetes mellitus   . Hypertension     There are no active problems to display for this patient.   Past Surgical History:  Procedure Laterality Date  . ABDOMINAL HYSTERECTOMY    . COLONOSCOPY N/A 05/19/2014   Procedure: COLONOSCOPY;  Surgeon: Rogene Houston, MD;  Location: [facility];  Service: Endoscopy;  Laterality: N/A;  730 - moved to 12/3 @ 10:30 Ann to notify pt     OB History   None      Home Medications    Prior to Admission medications   Medication Sig Start Date End Date Taking? Authorizing Provider  amLODipine (NORVASC) 2.5 MG tablet Take 2.5 mg by mouth daily.   Yes [provider]  aspirin EC 81 MG tablet Take 81 mg by mouth daily.    Yes [provider]  atorvastatin (LIPITOR) 20 MG tablet Take 20 mg by mouth every evening.  01/16/18  Yes [provider]  bisoprolol-hydrochlorothiazide (ZIAC) 10-6.25 MG per tablet Take 1 tablet by mouth daily. 11/06/13  Yes Idol, Almyra Free, PA-C  cholecalciferol (VITAMIN D) 1000 units tablet Take 1 tablet by mouth daily.    Yes [provider]  lisinopril (PRINIVIL,ZESTRIL) 20 MG tablet Take 1 tablet (20 mg total) by mouth daily. 11/06/13  Yes Idol, Almyra Free, PA-C  metFORMIN (GLUCOPHAGE) 500 MG tablet Take 1 tablet (500 mg total) by mouth 2 (two) times daily with a meal. 11/06/13  Yes Idol, Almyra Free, PA-C  clindamycin (CLEOCIN) 300 MG capsule Take 300 mg by mouth 4 (four) times daily. Take two capsules as initial dose. The following day, take one capsule 4 times daily until gone (#29 Capsules prescribed) 02/20/18   [provider]  ibuprofen (ADVIL,MOTRIN) 600 MG tablet Take 600 mg by mouth every 8 (eight) hours as needed for mild pain or moderate pain.  02/20/18   [provider]    Family History History reviewed. No pertinent family history.  Social History Social History   Tobacco Use  . Smoking status: Never Smoker  . Smokeless tobacco: Never Used  Substance Use Topics  . Alcohol use: Yes    Comment: rare  . Drug use: No  Allergies   Patient has no known allergies.   Review of Systems Review of Systems  All other systems reviewed and are negative.    Physical Exam Updated Vital Signs BP (!) 144/73   Pulse 85   Temp 99.3 F (37.4 C) (Temporal)   Resp 15   Wt 83.9 kg   SpO2 99%   BMI 34.96 kg/m   Physical Exam  Constitutional: She appears well-developed and well-nourished.  HENT:  Head: Normocephalic and atraumatic.  Multiple teeth which have been removed, there is an area in the right upper rear jaw where there has been molars removed that has a slow steady bleed.  There is no pulsatile flow, all other areas seem to have good  clot, the patient is not having difficulty with her airway, speech is normal, gag is normal, she is able to spit the blood as it drips down her throat.  Eyes: Conjunctivae are normal. Right eye exhibits no discharge. Left eye exhibits no discharge.  Pulmonary/Chest: Effort normal. No respiratory distress.  Neurological: She is alert. Coordination normal.  Skin: Skin is warm and dry. No rash noted. She is not diaphoretic. No erythema.  Psychiatric: She has a normal mood and affect.  Nursing note and vitals reviewed.    ED Treatments / Results  Labs (all labs ordered are listed, but only abnormal results are displayed) Labs Reviewed  BASIC METABOLIC PANEL - Abnormal; Notable for the following components:      Result Value   Glucose, Bld 126 (*)    All other components within normal limits  CBC WITH DIFFERENTIAL/PLATELET  PROTIME-INR  APTT    EKG None  Radiology No results found.  Procedures Procedures (including critical care time)  Medications Ordered in ED Medications  tranexamic acid (CYKLOKAPRON) injection 500 mg (500 mg Topical Given 02/20/18 1828)  tranexamic acid (CYKLOKAPRON) 1,000 mg in sodium chloride 0.9 % 100 mL IVPB (0 mg Intravenous Stopped 02/20/18 1941)  lidocaine-EPINEPHrine (XYLOCAINE W/EPI) 2 %-1:200000 (PF) injection (  Given by Other 02/20/18 2027)     Initial Impression / Assessment and Plan / ED Course  I have reviewed the triage vital signs and the nursing notes.  Pertinent labs & imaging results that were available during my care of the patient were reviewed by me and considered in my medical decision making (see chart for details).     The patient is not in distress however she is having continual bleeding at the site of dental extraction.  We will try to apply soaked TXA gauze in hopes that this will help slow the bleeding.  Ultimately we tried multiple interventions to help this patient including holding pressure with gauze, for 30 minutes, this  did not stop the bleeding  We tried a tea bag, soaked, 30 minutes, this did not stop the bleeding  I injected lidocaine with epinephrine on both the palatal side as well as the gingival side on the buccal surface of the wound, this did help somewhat, afterwards a gauze pad was sutured into the extraction bed, bleeding significantly decreased and almost completely stopped.  I discussed the patient's care with the dentist on-call, Dr. Geralynn Ochs who is kind enough to make recommendations including continuing to use soaked teabags wrapped in gauze at home.  To follow-up first thing on Monday.  The patient is in no distress, she is tolerated this extremely well.  She has not had to suction any blood from her mouth in the last 2 hours and at this time  appears very stable for discharge.  The patient is in total agreement.  Final Clinical Impressions(s) / ED Diagnoses   Final diagnoses:  Bleeding from mouth      Noemi Chapel, MD 02/20/18 2234

## 2018-02-20 NOTE — ED Notes (Addendum)
Pharmacy contacted for the missing TXA topical. The have left for the day and the tech attempted to text for an answer. AC contacted for the missing medication.

## 2018-02-20 NOTE — ED Triage Notes (Signed)
Pt had appox 7 teeth pulled today, is on 81mg  ASA daily, has been bleeding since procedure. States she was told to come to ED if she kept bleeding. Had packing in and was told to remove tomorrow, pt removed today d/t being blood soaked.

## 2018-02-20 NOTE — Discharge Instructions (Signed)
You will need to follow-up with your dentist first thing on Monday morning.  If you continue to have bleeding at home, please wrap a gauze around a tea bag, soak it in water and place this on the area that is bleeding, bite down gently and hold pressure for 20 to 30 minutes.  If you are continuing to bleed try this again, if the bleeding does not stop after 1 hour, or if it is severe or heavy, return to the emergency department.

## 2018-04-24 ENCOUNTER — Other Ambulatory Visit (HOSPITAL_COMMUNITY): Payer: Self-pay | Admitting: Family Medicine

## 2018-04-24 DIAGNOSIS — Z1231 Encounter for screening mammogram for malignant neoplasm of breast: Secondary | ICD-10-CM

## 2018-06-08 ENCOUNTER — Ambulatory Visit (HOSPITAL_COMMUNITY): Payer: Medicare Other

## 2018-06-15 ENCOUNTER — Ambulatory Visit (HOSPITAL_COMMUNITY)
Admission: RE | Admit: 2018-06-15 | Discharge: 2018-06-15 | Disposition: A | Discharge status: Home / Self Care | Payer: Medicare Other | Source: Ambulatory Visit | Attending: Family Medicine | Admitting: Family Medicine

## 2018-06-15 ENCOUNTER — Encounter (HOSPITAL_COMMUNITY): Payer: Self-pay

## 2018-06-15 DIAGNOSIS — Z1231 Encounter for screening mammogram for malignant neoplasm of breast: Secondary | ICD-10-CM | POA: Insufficient documentation

## 2019-05-07 ENCOUNTER — Other Ambulatory Visit (HOSPITAL_COMMUNITY): Payer: Self-pay | Admitting: Family Medicine

## 2019-05-07 DIAGNOSIS — Z1231 Encounter for screening mammogram for malignant neoplasm of breast: Secondary | ICD-10-CM

## 2019-06-21 ENCOUNTER — Ambulatory Visit (HOSPITAL_COMMUNITY): Payer: Medicare Other

## 2019-08-04 ENCOUNTER — Ambulatory Visit (HOSPITAL_COMMUNITY)
Admission: RE | Admit: 2019-08-04 | Discharge: 2019-08-04 | Disposition: A | Discharge status: Home / Self Care | Payer: Medicare Other | Source: Ambulatory Visit | Attending: Family Medicine | Admitting: Family Medicine

## 2019-08-04 ENCOUNTER — Other Ambulatory Visit: Payer: Self-pay

## 2019-08-04 DIAGNOSIS — Z1231 Encounter for screening mammogram for malignant neoplasm of breast: Secondary | ICD-10-CM | POA: Insufficient documentation

## 2019-09-17 ENCOUNTER — Ambulatory Visit: Payer: Medicare Other | Attending: Internal Medicine

## 2019-09-17 DIAGNOSIS — Z23 Encounter for immunization: Secondary | ICD-10-CM

## 2019-09-17 NOTE — Progress Notes (Signed)
   Covid-19 Vaccination Clinic  Name:  Janet Short    MRN: [id number] DOB: 1951-04-28  09/17/2019  Janet Short was observed post Covid-19 immunization for 15 minutes without incident. She was provided with Vaccine Information Sheet and instruction to access the V-Safe system.   Janet Short was instructed to call 911 with any severe reactions post vaccine: Marland Kitchen Difficulty breathing  . Swelling of face and throat  . A fast heartbeat  . A bad rash all over body  . Dizziness and weakness   Immunizations Administered    Name Date Dose VIS Date Route   Moderna COVID-19 Vaccine 09/17/2019  8:58 AM 0.5 mL 05/18/2019 Intramuscular   Manufacturer: Moderna   Lot: [id number]   [id number]

## 2019-10-19 ENCOUNTER — Ambulatory Visit: Payer: Medicare Other | Attending: Internal Medicine

## 2019-10-19 DIAGNOSIS — Z23 Encounter for immunization: Secondary | ICD-10-CM

## 2019-10-19 NOTE — Progress Notes (Signed)
   Covid-19 Vaccination Clinic  Name:  Janet Short    MRN: [id number] DOB: January 11, 1951  10/19/2019  [name] was observed post Covid-19 immunization for 15 minutes without incident. She was provided with Vaccine Information Sheet and instruction to access the V-Safe system.   [name] was instructed to call 911 with any severe reactions post vaccine: Marland Kitchen Difficulty breathing  . Swelling of face and throat  . A fast heartbeat  . A bad rash all over body  . Dizziness and weakness   Immunizations Administered    Name Date Dose VIS Date Route   Moderna COVID-19 Vaccine 10/19/2019  9:13 AM 0.5 mL 05/2019 Intramuscular   Manufacturer: Moderna   Lot: [id number]   [id number]

## 2020-10-20 ENCOUNTER — Other Ambulatory Visit (HOSPITAL_COMMUNITY): Payer: Self-pay | Admitting: Family Medicine

## 2020-10-20 DIAGNOSIS — Z1231 Encounter for screening mammogram for malignant neoplasm of breast: Secondary | ICD-10-CM

## 2020-10-27 ENCOUNTER — Other Ambulatory Visit: Payer: Self-pay

## 2020-10-27 ENCOUNTER — Ambulatory Visit (HOSPITAL_COMMUNITY)
Admission: RE | Admit: 2020-10-27 | Discharge: 2020-10-27 | Disposition: A | Discharge status: Home / Self Care | Payer: Medicare Other | Source: Ambulatory Visit | Attending: Family Medicine | Admitting: Family Medicine

## 2020-10-27 DIAGNOSIS — Z1231 Encounter for screening mammogram for malignant neoplasm of breast: Secondary | ICD-10-CM | POA: Diagnosis not present

## 2021-01-04 IMAGING — MG DIGITAL SCREENING BILAT W/ TOMO W/ CAD
6 of 12 series · 6 of 36 positions shown · non-contrast
Comparison: Previous exam(s).

CLINICAL DATA: Screening.

EXAM:
DIGITAL SCREENING BILATERAL MAMMOGRAM WITH TOMO AND CAD

[L CC synth-2D]
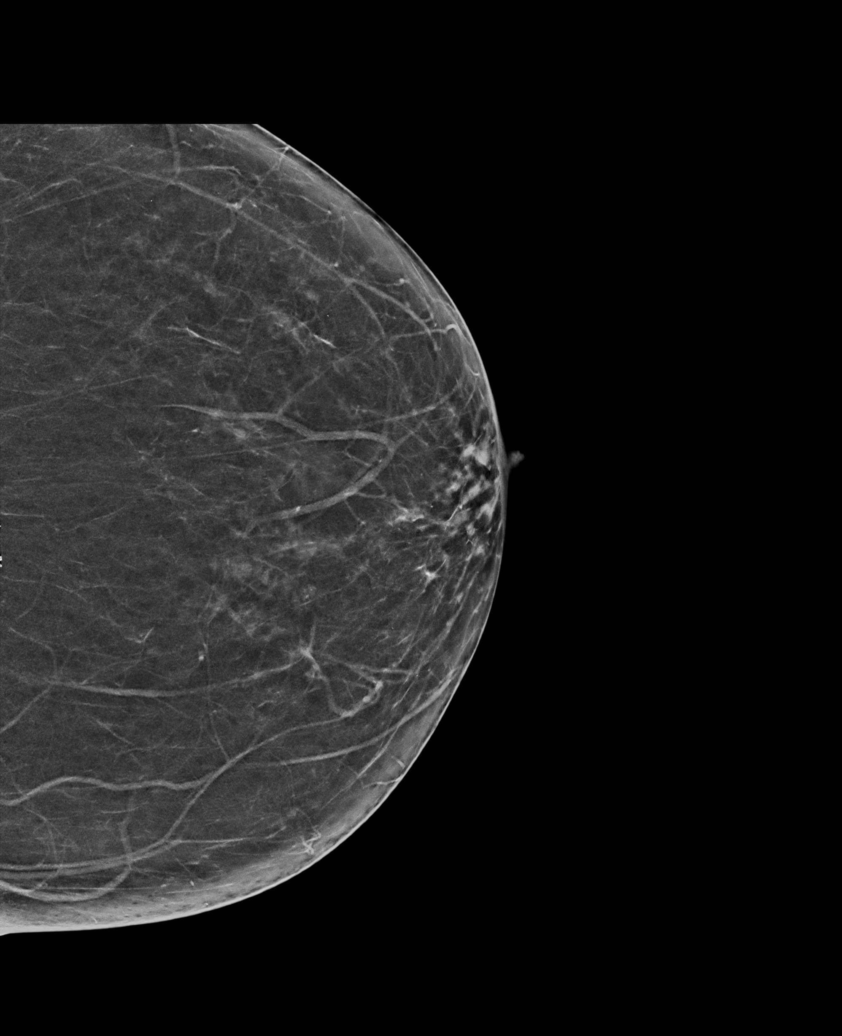

[R MLO synth-2D (1 of 2)]
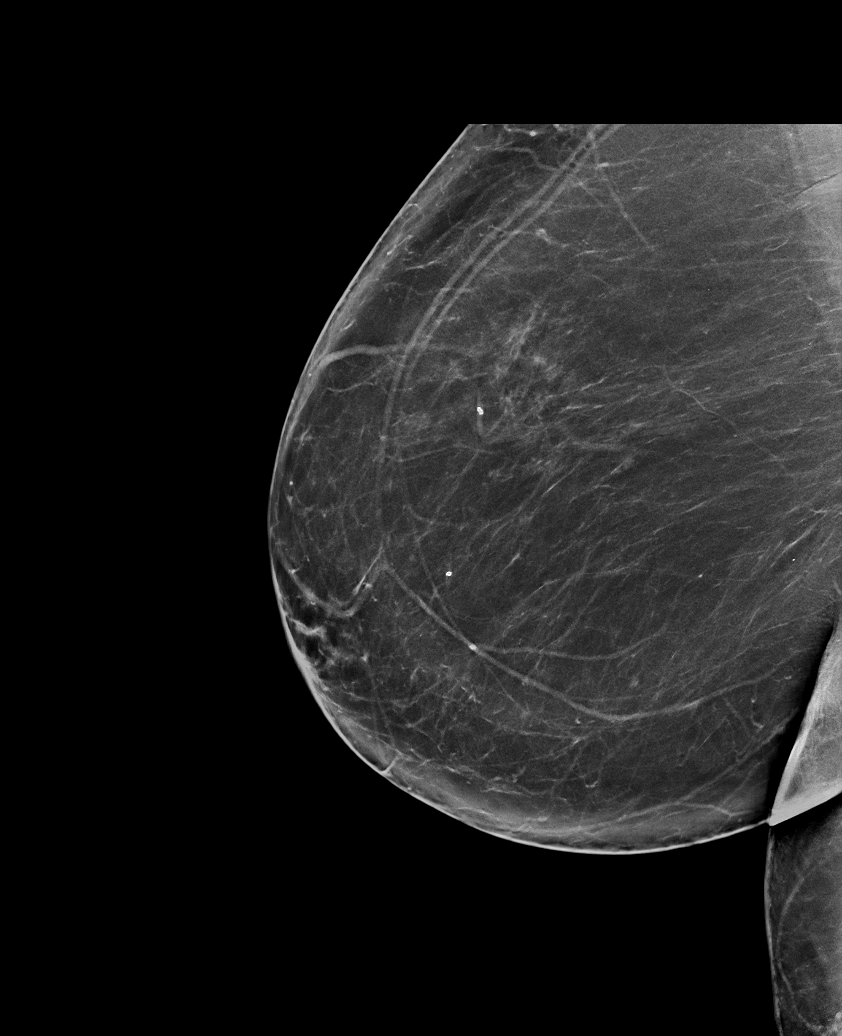

[R MLO synth-2D (2 of 2)]
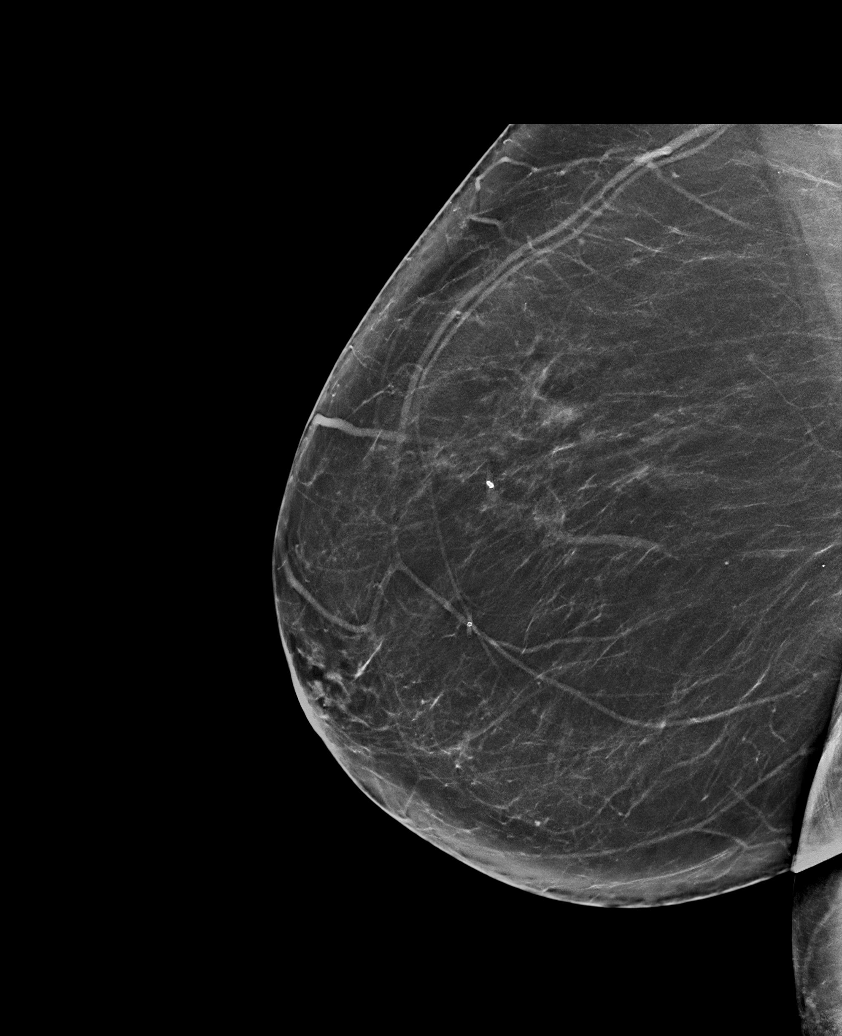

[R CC synth-2D]
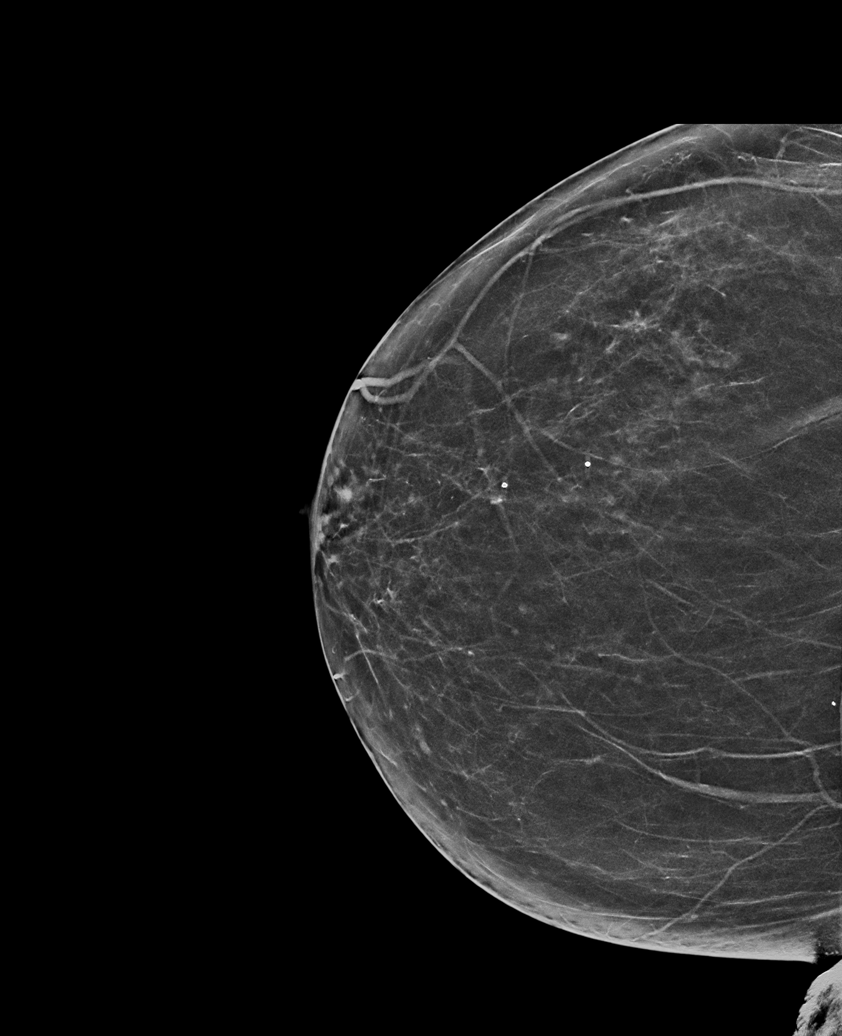

[L MLO synth-2D (1 of 2)]
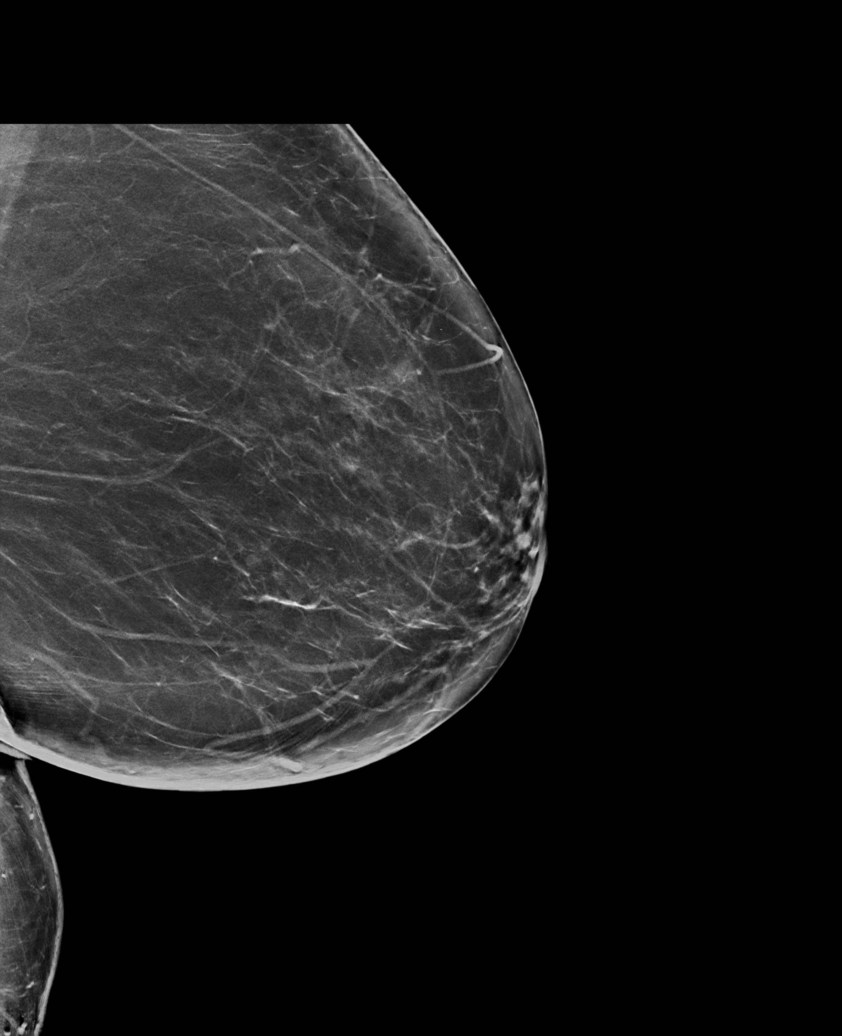

[L MLO synth-2D (2 of 2)]
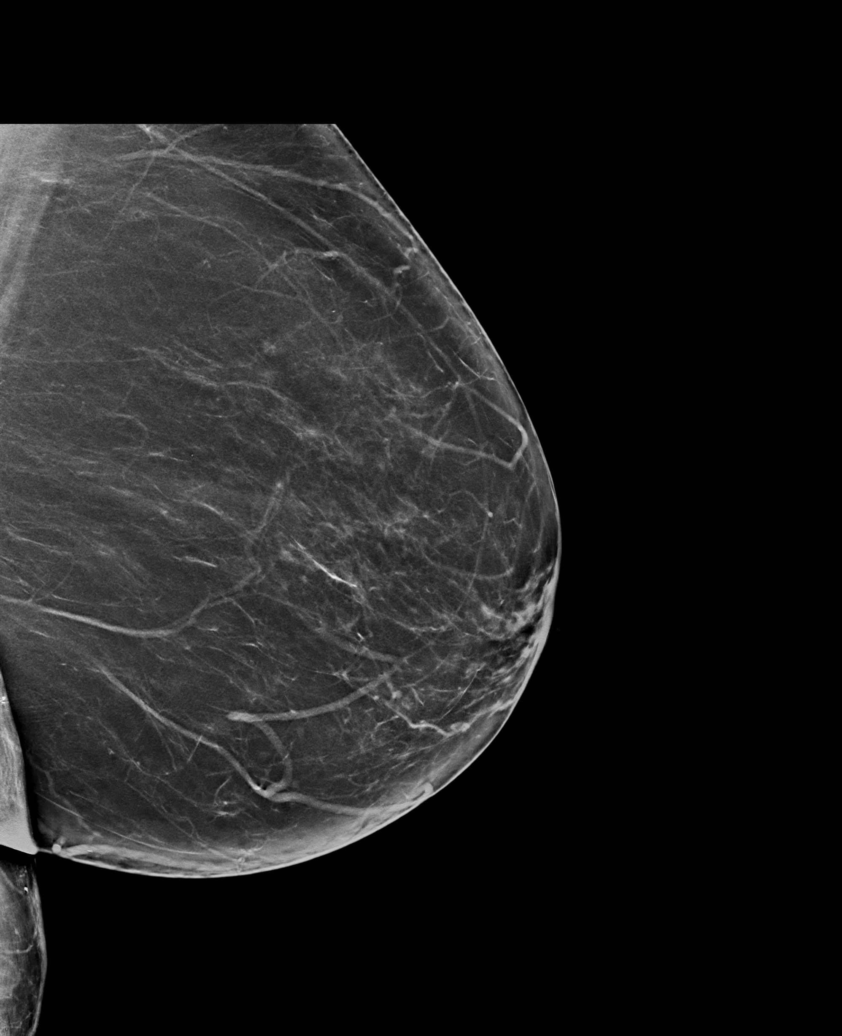

[6 of 36 positions shown; findings below may reference images not displayed]

ACR Breast Density Category b: There are scattered areas of
fibroglandular density.
FINDINGS: There are no findings suspicious for malignancy. Images were
processed with CAD.
IMPRESSION: No mammographic evidence of malignancy. A result letter of this
screening mammogram will be mailed directly to the patient.

RECOMMENDATION:
Screening mammogram in one year. (Code:[id number])

BI-RADS CATEGORY  1: Negative.

## 2021-08-20 ENCOUNTER — Other Ambulatory Visit (HOSPITAL_COMMUNITY): Payer: Self-pay | Admitting: Family Medicine

## 2021-08-20 DIAGNOSIS — Z780 Asymptomatic menopausal state: Secondary | ICD-10-CM

## 2021-09-13 ENCOUNTER — Other Ambulatory Visit (HOSPITAL_COMMUNITY): Payer: Self-pay | Admitting: Family Medicine

## 2021-09-13 DIAGNOSIS — Z1231 Encounter for screening mammogram for malignant neoplasm of breast: Secondary | ICD-10-CM

## 2021-10-31 ENCOUNTER — Ambulatory Visit (HOSPITAL_COMMUNITY)
Admission: RE | Admit: 2021-10-31 | Discharge: 2021-10-31 | Disposition: A | Discharge status: Home / Self Care | Payer: Medicare Other | Source: Ambulatory Visit | Attending: Family Medicine | Admitting: Family Medicine

## 2021-10-31 DIAGNOSIS — Z780 Asymptomatic menopausal state: Secondary | ICD-10-CM | POA: Diagnosis present

## 2021-10-31 DIAGNOSIS — Z1231 Encounter for screening mammogram for malignant neoplasm of breast: Secondary | ICD-10-CM | POA: Insufficient documentation

## 2023-01-02 NOTE — H&P (Signed)
Surgical History & Physical  Patient Name: Janet Short  DOB: September 30, 1950  Surgery: Cataract extraction with intraocular lens implant phacoemulsification; Left Eye Surgeon: Pecolia Ades MD Surgery Date: 01/10/2023 Pre-Op Date: 11/19/2022  HPI: A 89 Yr. old female patient present for cataract eval per Dr. Charise Killian. 1. The patient complains of difficulty when recognizing people, reading fine print, seeing the big screens at church with writing on them, which began 1 year ago. Both eyes are affected, OS>OD. The episode is constant. The condition's severity is worsening. This is negatively affecting the patient's quality of life and the patient is unable to function adequately in life with the current level of vision. HPI was performed by Pecolia Ades .  Medical History: Cataracts  Diabetes High Blood Pressure LDL  Review of Systems Cardiovascular High Blood Pressure Musculoskeletal ankle pain All recorded systems are negative except as noted above.  Social Never smoked   Medication Aspirin, Bisoprolol, Metformin, Atorvastatin, Lisinopril  Sx/Procedures Hysterectomy  Drug Allergies  NKDA  History & Physical: Heent: cataracts NECK: supple without bruits LUNGS: lungs clear to auscultation CV: regular rate and rhythm Abdomen: soft and non-tender  Impression & Plan: Assessment: 1.  CATARACT AGE-RELATED NUCLEAR; Both Eyes (H25.13) 2.  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  Myopia ; Both Eyes (H52.13) 4.  Family History of Glaucoma (Z83.511)  Plan: 1.  Cataracts are visually significant and account for the patient's complaints. Discussed all risks, benefits, procedures and recovery, including infection, loss of vision and eye, need for glasses after surgery or additional procedures. Patient understands changing glasses will not improve vision. Patient indicated understanding of procedure. All questions answered. Patient desires to have surgery, recommend  phacoemulsification with intraocular lens. Patient to have preliminary testing necessary (Argos/IOL Master, Mac OCT, TOPO) Educational materials provided:Cataract. RECOMMEND STANDARD VS EYEHANCE VS TORIC VS VIVTY  Plan: - Proceed with cataract surgery OS, followed by OD - DIB00 lens with best distance target OU - Ks noted to be somewhat steeper OS - DM without DR - no prior eye surgeries, ok lying flat on back - Likely need trypan  2.  DM diagnosed around 2004. Well controlled. No retinopathy at this time. Encouraged good BP and BS control.  3.  Will follow up with Dr. Charise Killian  4.  No glaucoma. Observe. Will obtain OCT RFNL after cataract surgery

## 2023-01-06 ENCOUNTER — Encounter (HOSPITAL_COMMUNITY)
Admission: RE | Admit: 2023-01-06 | Discharge: 2023-01-06 | Disposition: A | Discharge status: Home / Self Care | Payer: 59 | Source: Ambulatory Visit | Attending: Optometry | Admitting: Optometry

## 2023-01-06 ENCOUNTER — Encounter (HOSPITAL_COMMUNITY): Payer: Self-pay

## 2023-01-10 ENCOUNTER — Ambulatory Visit (HOSPITAL_BASED_OUTPATIENT_CLINIC_OR_DEPARTMENT_OTHER): Payer: 59 | Admitting: Certified Registered"

## 2023-01-10 ENCOUNTER — Ambulatory Visit (HOSPITAL_COMMUNITY): Payer: 59 | Admitting: Certified Registered"

## 2023-01-10 ENCOUNTER — Encounter (HOSPITAL_COMMUNITY)
Admission: RE | Disposition: A | Discharge status: Home / Self Care | Payer: Self-pay | Source: Ambulatory Visit | Attending: Optometry

## 2023-01-10 ENCOUNTER — Ambulatory Visit (HOSPITAL_COMMUNITY)
Admission: RE | Admit: 2023-01-10 | Discharge: 2023-01-10 | Disposition: A | Discharge status: Home / Self Care | Payer: 59 | Source: Ambulatory Visit | Attending: Optometry | Admitting: Optometry

## 2023-01-10 DIAGNOSIS — E1136 Type 2 diabetes mellitus with diabetic cataract: Secondary | ICD-10-CM | POA: Insufficient documentation

## 2023-01-10 DIAGNOSIS — Z83511 Family history of glaucoma: Secondary | ICD-10-CM | POA: Diagnosis not present

## 2023-01-10 DIAGNOSIS — I10 Essential (primary) hypertension: Secondary | ICD-10-CM | POA: Insufficient documentation

## 2023-01-10 DIAGNOSIS — H2512 Age-related nuclear cataract, left eye: Secondary | ICD-10-CM | POA: Insufficient documentation

## 2023-01-10 HISTORY — PX: CATARACT EXTRACTION W/PHACO: SHX586

## 2023-01-10 LAB — GLUCOSE, CAPILLARY: Glucose-Capillary: 136 mg/dL — ABNORMAL HIGH (ref 70–99)

## 2023-01-10 SURGERY — PHACOEMULSIFICATION, CATARACT, WITH IOL INSERTION
Anesthesia: General | Site: Eye | Laterality: Left

## 2023-01-10 MED ORDER — PHENYLEPHRINE HCL 2.5 % OP SOLN
1.0000 [drp] | OPHTHALMIC | Status: AC | PRN
  Administered 2023-01-10 (×3): 1 [drp] via OPHTHALMIC

## 2023-01-10 MED ORDER — TRYPAN BLUE 0.06 % IO SOSY
PREFILLED_SYRINGE | INTRAOCULAR | Status: AC
  Filled 2023-01-10: qty 0.5

## 2023-01-10 MED ORDER — TROPICAMIDE 1 % OP SOLN
1.0000 [drp] | OPHTHALMIC | Status: AC | PRN
  Administered 2023-01-10 (×3): 1 [drp] via OPHTHALMIC

## 2023-01-10 MED ORDER — LIDOCAINE HCL (PF) 1 % IJ SOLN
INTRAMUSCULAR | Status: DC | PRN
  Administered 2023-01-10: 1 mL

## 2023-01-10 MED ORDER — TETRACAINE HCL 0.5 % OP SOLN
1.0000 [drp] | OPHTHALMIC | Status: AC | PRN
  Administered 2023-01-10 (×3): 1 [drp] via OPHTHALMIC

## 2023-01-10 MED ORDER — BSS IO SOLN
INTRAOCULAR | Status: DC | PRN
  Administered 2023-01-10: 15 mL via INTRAOCULAR

## 2023-01-10 MED ORDER — SIGHTPATH DOSE#1 NA HYALUR & NA CHOND-NA HYALUR IO KIT
PACK | INTRAOCULAR | Status: DC | PRN
  Administered 2023-01-10: 1 via OPHTHALMIC

## 2023-01-10 MED ORDER — FENTANYL CITRATE (PF) 100 MCG/2ML IJ SOLN
INTRAMUSCULAR | Status: DC | PRN
  Administered 2023-01-10: 50 ug via INTRAVENOUS

## 2023-01-10 MED ORDER — FENTANYL CITRATE (PF) 100 MCG/2ML IJ SOLN
INTRAMUSCULAR | Status: AC
  Filled 2023-01-10: qty 2

## 2023-01-10 MED ORDER — MIDAZOLAM HCL 2 MG/2ML IJ SOLN
INTRAMUSCULAR | Status: DC | PRN
  Administered 2023-01-10: 1 mg via INTRAVENOUS

## 2023-01-10 MED ORDER — TETRACAINE 0.5 % OP SOLN OPTIME - NO CHARGE
OPHTHALMIC | Status: DC | PRN
  Administered 2023-01-10: 2 [drp] via OPHTHALMIC

## 2023-01-10 MED ORDER — LIDOCAINE HCL 3.5 % OP GEL
1.0000 | Freq: Once | OPHTHALMIC | Status: AC
  Administered 2023-01-10: 1 via OPHTHALMIC

## 2023-01-10 MED ORDER — SODIUM CHLORIDE 0.9% FLUSH
INTRAVENOUS | Status: DC | PRN
  Administered 2023-01-10: 6 mL via INTRAVENOUS

## 2023-01-10 MED ORDER — STERILE WATER FOR IRRIGATION IR SOLN
Status: DC | PRN
  Administered 2023-01-10: 1

## 2023-01-10 MED ORDER — PHENYLEPHRINE-KETOROLAC 1-0.3 % IO SOLN
INTRAOCULAR | Status: DC | PRN
  Administered 2023-01-10: 500 mL via OPHTHALMIC

## 2023-01-10 MED ORDER — NEOMYCIN-POLYMYXIN-DEXAMETH 3.5-10000-0.1 OP SUSP
OPHTHALMIC | Status: DC | PRN
  Administered 2023-01-10: 2 [drp] via OPHTHALMIC

## 2023-01-10 MED ORDER — POVIDONE-IODINE 5 % OP SOLN
OPHTHALMIC | Status: DC | PRN
  Administered 2023-01-10: 1 via OPHTHALMIC

## 2023-01-10 MED ORDER — MIDAZOLAM HCL 2 MG/2ML IJ SOLN
INTRAMUSCULAR | Status: AC
  Filled 2023-01-10: qty 2

## 2023-01-10 SURGICAL SUPPLY — 14 items
CATARACT SUITE SIGHTPATH (MISCELLANEOUS) ×1 IMPLANT
CLOTH BEACON ORANGE TIMEOUT ST (SAFETY) ×1 IMPLANT
DRSG TEGADERM 4X4.75 (GAUZE/BANDAGES/DRESSINGS) ×1 IMPLANT
EYE SHIELD UNIVERSAL CLEAR (GAUZE/BANDAGES/DRESSINGS) IMPLANT
FEE CATARACT SUITE SIGHTPATH (MISCELLANEOUS) ×1 IMPLANT
GLOVE BIOGEL PI IND STRL 7.0 (GLOVE) ×2 IMPLANT
LENS IOL TECNIS EYHANCE 17.5 (Intraocular Lens) IMPLANT
NDL HYPO 18GX1.5 BLUNT FILL (NEEDLE) ×1 IMPLANT
NEEDLE HYPO 18GX1.5 BLUNT FILL (NEEDLE) ×1 IMPLANT
PAD ARMBOARD 7.5X6 YLW CONV (MISCELLANEOUS) ×1 IMPLANT
POSITIONER HEAD 8X9X4 ADT (SOFTGOODS) ×1 IMPLANT
SYR TB 1ML LL NO SAFETY (SYRINGE) ×1 IMPLANT
TAPE SURG TRANSPORE 1 IN (GAUZE/BANDAGES/DRESSINGS) IMPLANT
WATER STERILE IRR 250ML POUR (IV SOLUTION) ×1 IMPLANT

## 2023-01-10 NOTE — Anesthesia Preprocedure Evaluation (Addendum)
Anesthesia Evaluation  Patient identified by MRN, date of birth, ID band Patient awake    Reviewed: Allergy & Precautions, H&P , NPO status , Patient's Chart, lab work & pertinent test results, reviewed documented beta blocker date and time   Airway Mallampati: II  TM Distance: >3 FB Neck ROM: full    Dental no notable dental hx.    Pulmonary neg pulmonary ROS   Pulmonary exam normal breath sounds clear to auscultation       Cardiovascular Exercise Tolerance: Good hypertension,  Rhythm:regular Rate:Normal     Neuro/Psych negative neurological ROS  negative psych ROS   GI/Hepatic negative GI ROS, Neg liver ROS,,,  Endo/Other  diabetes    Renal/GU negative Renal ROS  negative genitourinary   Musculoskeletal   Abdominal   Peds  Hematology negative hematology ROS (+)   Anesthesia Other Findings   Reproductive/Obstetrics negative OB ROS                             Anesthesia Physical Anesthesia Plan  ASA: 2  Anesthesia Plan: General   Post-op Pain Management:    Induction:   PONV Risk Score and Plan:   Airway Management Planned:   Additional Equipment:   Intra-op Plan:   Post-operative Plan:   Informed Consent: I have reviewed the patients History and Physical, chart, labs and discussed the procedure including the risks, benefits and alternatives for the proposed anesthesia with the patient or authorized representative who has indicated his/her understanding and acceptance.     Dental Advisory Given  Plan Discussed with: CRNA  Anesthesia Plan Comments:        Anesthesia Quick Evaluation

## 2023-01-10 NOTE — Transfer of Care (Addendum)
Immediate Anesthesia Transfer of Care Note  Patient: Janet Short  Procedure(s) Performed: CATARACT EXTRACTION PHACO AND INTRAOCULAR LENS PLACEMENT (IOC) (Left: Eye)  Patient Location: Short Stay  Anesthesia Type:MAC  Level of Consciousness: awake, alert , and patient cooperative  Airway & Oxygen Therapy: Patient Spontanous Breathing  Post-op Assessment: Report given to RN and Post -op Vital signs reviewed and stable  Post vital signs: Reviewed and stable  Last Vitals:  Vitals Value Taken Time  BP 150/59 01/10/23   1126  Temp 36.4 01/10/23   1126  Pulse 73 01/10/23   1126  Resp 16 01/10/23   1126  SpO2 98% 01/10/23   1126    Last Pain:  Vitals:   01/10/23 0937  TempSrc: Oral  PainSc: 0-No pain      Patients Stated Pain Goal: 6 (01/10/23 0937)  Complications: No notable events documented.

## 2023-01-10 NOTE — Discharge Instructions (Signed)
Please discharge patient when stable, will follow up today with Dr. Snyder at the [facility] office immediately following discharge.  Leave shield in place until visit.  All paperwork with discharge instructions will be given at the office.  [facility] Address:  [address]  [place], [place] [address]  Dr. Snyder's Phone: [phone]  

## 2023-01-10 NOTE — Op Note (Signed)
Date of procedure: 01/10/23  Pre-operative diagnosis: Visually significant age-related nuclear cataract, Left Eye (H25.12)  Post-operative diagnosis: Visually significant age-related nuclear cataract, Left Eye  Procedure: Removal of cataract via phacoemulsification and insertion of intra-ocular lens J&J DIB00 +17.5D into the capsular bag of the Left Eye  Attending surgeon: Ronal Fear, MD  Anesthesia: MAC, Topical Akten  Complications: None  Estimated Blood Loss: <6mL (minimal)  Specimens: None  Implants:  Implant Name Type Inv. Item Serial No. Manufacturer Lot No. LRB No. Used Action  LENS IOL TECNIS EYHANCE 17.5 - [id number] Intraocular Lens LENS IOL TECNIS EYHANCE 17.5 [id number] SIGHTPATH  Left 1 Implanted    Indications:  Visually significant age-related cataract, Left Eye  Procedure:  The patient was seen and identified in the pre-operative area. The operative eye was identified and dilated.  The operative eye was marked.  Topical anesthesia was administered to the operative eye.     The patient was then to the operative suite and placed in the supine position.  A timeout was performed confirming the patient, procedure to be performed, and all other relevant information.   The patient's face was prepped and draped in the usual fashion for intra-ocular surgery.  A lid speculum was placed into the operative eye and the surgical microscope moved into place and focused.  An inferotemporal paracentesis was created using a 20 gauge paracentesis blade.  BSS mixed with Omidria, followed by 1% lidocaine was injected into the anterior chamber.  Viscoelastic was injected into the anterior chamber.  A temporal clear-corneal main wound incision was created using a 2.45mm microkeratome.  A continuous curvilinear capsulorrhexis was initiated using an irrigating cystitome and completed using capsulorrhexis forceps.  Hydrodissection and hydrodeliniation were performed.  Viscoelastic was  injected into the anterior chamber.  A phacoemulsification handpiece and a chopper as a second instrument were used to remove the nucleus and epinucleus. The irrigation/aspiration handpiece was used to remove any remaining cortical material.   The capsular bag was reinflated with viscoelastic, checked, and found to be intact.  The intraocular lens was inserted into the capsular bag.  The irrigation/aspiration handpiece was used to remove any remaining viscoelastic.  The clear corneal wound and paracentesis wounds were then hydrated and checked with Weck-Cels to be watertight.  The lid-speculum and drape was removed, and the patient's face was cleaned with a wet and dry 4x4.  Maxitrol drops were instilled onto the eye. A clear shield was taped over the eye. The patient was taken to the post-operative care unit in good condition, having tolerated the procedure well.  Post-Op Instructions: The patient will follow up at [facility] for a same day post-operative evaluation and will receive all other orders and instructions.

## 2023-01-10 NOTE — Interval H&P Note (Signed)
History and Physical Interval Note:  01/10/2023 10:06 AM  The H and P was reviewed and updated. The patient was examined.  No changes were found after exam.  The surgical eye was marked.  Janet Short

## 2023-01-13 ENCOUNTER — Encounter (HOSPITAL_COMMUNITY): Payer: Self-pay | Admitting: Optometry

## 2023-01-13 NOTE — Anesthesia Postprocedure Evaluation (Signed)
Anesthesia Post Note  Patient: DELEE REDDING  Procedure(s) Performed: CATARACT EXTRACTION PHACO AND INTRAOCULAR LENS PLACEMENT (IOC) (Left: Eye)  Patient location during evaluation: Phase II Anesthesia Type: General Level of consciousness: awake Pain management: pain level controlled Vital Signs Assessment: post-procedure vital signs reviewed and stable Respiratory status: spontaneous breathing and respiratory function stable Cardiovascular status: blood pressure returned to baseline and stable Postop Assessment: no headache and no apparent nausea or vomiting Anesthetic complications: no Comments: Late entry   No notable events documented.   Last Vitals:  Vitals:   01/10/23 0937 01/10/23 1126  BP: (!) 181/73 (!) 150/59  Pulse: 63 73  Resp: 18 16  Temp: 36.6 C (!) 36.4 C  SpO2: 100% 98%    Last Pain:  Vitals:   01/10/23 1126  TempSrc:   PainSc: 0-No pain                 Windell Norfolk

## 2023-01-20 ENCOUNTER — Ambulatory Visit: Payer: Medicare Other | Admitting: Family Medicine

## 2023-01-20 ENCOUNTER — Encounter (HOSPITAL_COMMUNITY)
Admission: RE | Admit: 2023-01-20 | Discharge: 2023-01-20 | Disposition: A | Discharge status: Home / Self Care | Payer: 59 | Source: Ambulatory Visit | Attending: Optometry | Admitting: Optometry

## 2023-01-22 NOTE — H&P (Signed)
Surgical History & Physical  Patient Name: Miliana Dozois  DOB: November 21, 1950  Surgery: Cataract extraction with intraocular lens implant phacoemulsification; Right Eye Surgeon: Pecolia Ades MD Surgery Date: 01/24/2023 Pre-Op Date: 01/15/2023  HPI: A 54 Yr. old female patient 1. The patient is returning after cataract surgery. The left eye is affected. Since the last visit, the affected area is doing well. The patient's vision is improved. The condition's severity is constant. Patient is following medication instructions.  Medical History: Cataracts  Diabetes High Blood Pressure LDL  Review of Systems Negative Allergic/Immunologic Hypertension Cardiovascular Negative Constitutional Negative Ear, Nose, Mouth & Throat Diabetes Endocrine Negative Eyes Negative Gastrointestinal Negative Genitourinary Negative Hemotologic/Lymphatic Negative Integumentary Negative Musculoskeletal Negative Neurological Negative Psychiatry Negative Respiratory  Social Never smoked   Medication Ciprofloxacain, Ketorolac, Prednisone,  Aspirin, Bisoprolol, Metformin, Atorvastatin, Lisinopril  Sx/Procedures Phaco c IOL OS,  Hysterectomy  Drug Allergies  NKDA  History & Physical: Heent: cataract OD, PCL OS NECK: supple without bruits LUNGS: lungs clear to auscultation CV: regular rate and rhythm Abdomen: soft and non-tender  Impression & Plan: Assessment: 1.  CATARACT EXTRACTION STATUS; Left Eye (Z98.42) 2.  INTRAOCULAR LENS IOL (Z96.1) 3.  CATARACT AGE-RELATED NUCLEAR; , Right Eye (H25.11) 4.  Hyperopia (H52.02)  Plan: 1.  POD#5 Continue with eye drops as directed. Reviewed all post-op precautions. Call with increased redness, swelling, pain or loss of vision. Avoid swimming pools and hot tubs for 1 week.  2.  Doing well since surgery  3.  Cataracts are visually significant and account for the patient's complaints. Discussed all risks, benefits, procedures and recovery, including  infection, loss of vision and eye, need for glasses after surgery or additional procedures. Patient understands changing glasses will not improve vision. Patient indicated understanding of procedure. All questions answered. Patient desires to have surgery, recommend phacoemulsification with intraocular lens. Patient to have preliminary testing necessary (Argos/IOL Master, Mac OCT, TOPO) Educational materials provided:Cataract. RECOMMEND STANDARD VS EYEHANCE VS TORIC VS VIVTY  Plan: - Proceed with cataract surgery OD - DIB00 lens with best distance target OU - Ks noted to be somewhat steeper OS - DM without DR - no prior eye surgeries, ok lying flat on back - Likely need trypan  4.  Will follow up with Dr. Charise Killian

## 2023-01-24 ENCOUNTER — Other Ambulatory Visit: Payer: Self-pay

## 2023-01-24 ENCOUNTER — Encounter (HOSPITAL_COMMUNITY)
Admission: RE | Disposition: A | Discharge status: Home / Self Care | Payer: Self-pay | Source: Ambulatory Visit | Attending: Optometry

## 2023-01-24 ENCOUNTER — Ambulatory Visit (HOSPITAL_COMMUNITY): Payer: 59 | Admitting: Anesthesiology

## 2023-01-24 ENCOUNTER — Ambulatory Visit (HOSPITAL_BASED_OUTPATIENT_CLINIC_OR_DEPARTMENT_OTHER): Payer: 59 | Admitting: Anesthesiology

## 2023-01-24 ENCOUNTER — Encounter (HOSPITAL_COMMUNITY): Payer: Self-pay | Admitting: Optometry

## 2023-01-24 ENCOUNTER — Ambulatory Visit (HOSPITAL_COMMUNITY)
Admission: RE | Admit: 2023-01-24 | Discharge: 2023-01-24 | Disposition: A | Discharge status: Home / Self Care | Payer: 59 | Source: Ambulatory Visit | Attending: Optometry | Admitting: Optometry

## 2023-01-24 DIAGNOSIS — H5202 Hypermetropia, left eye: Secondary | ICD-10-CM | POA: Insufficient documentation

## 2023-01-24 DIAGNOSIS — I10 Essential (primary) hypertension: Secondary | ICD-10-CM | POA: Diagnosis not present

## 2023-01-24 DIAGNOSIS — Z9842 Cataract extraction status, left eye: Secondary | ICD-10-CM | POA: Insufficient documentation

## 2023-01-24 DIAGNOSIS — Z7984 Long term (current) use of oral hypoglycemic drugs: Secondary | ICD-10-CM | POA: Insufficient documentation

## 2023-01-24 DIAGNOSIS — H2521 Age-related cataract, morgagnian type, right eye: Secondary | ICD-10-CM | POA: Insufficient documentation

## 2023-01-24 DIAGNOSIS — E1136 Type 2 diabetes mellitus with diabetic cataract: Secondary | ICD-10-CM | POA: Diagnosis not present

## 2023-01-24 DIAGNOSIS — H2511 Age-related nuclear cataract, right eye: Secondary | ICD-10-CM

## 2023-01-24 DIAGNOSIS — Z961 Presence of intraocular lens: Secondary | ICD-10-CM | POA: Diagnosis not present

## 2023-01-24 HISTORY — DX: Anemia, unspecified: D64.9

## 2023-01-24 HISTORY — PX: CATARACT EXTRACTION W/PHACO: SHX586

## 2023-01-24 LAB — GLUCOSE, CAPILLARY: Glucose-Capillary: 145 mg/dL — ABNORMAL HIGH (ref 70–99)

## 2023-01-24 SURGERY — PHACOEMULSIFICATION, CATARACT, WITH IOL INSERTION
Anesthesia: Monitor Anesthesia Care | Site: Eye | Laterality: Right

## 2023-01-24 MED ORDER — TRYPAN BLUE 0.06 % IO SOSY
PREFILLED_SYRINGE | INTRAOCULAR | Status: AC
  Filled 2023-01-24: qty 0.5

## 2023-01-24 MED ORDER — NEOMYCIN-POLYMYXIN-DEXAMETH 3.5-10000-0.1 OP SUSP
OPHTHALMIC | Status: DC | PRN
  Administered 2023-01-24: 2 [drp] via OPHTHALMIC

## 2023-01-24 MED ORDER — MIDAZOLAM HCL 2 MG/2ML IJ SOLN
INTRAMUSCULAR | Status: AC
  Filled 2023-01-24: qty 2

## 2023-01-24 MED ORDER — SIGHTPATH DOSE#1 NA HYALUR & NA CHOND-NA HYALUR IO KIT
PACK | INTRAOCULAR | Status: DC | PRN
  Administered 2023-01-24: 1 via OPHTHALMIC

## 2023-01-24 MED ORDER — LIDOCAINE HCL (PF) 1 % IJ SOLN
INTRAMUSCULAR | Status: DC | PRN
  Administered 2023-01-24: 1 mL

## 2023-01-24 MED ORDER — PHENYLEPHRINE HCL 2.5 % OP SOLN
1.0000 [drp] | OPHTHALMIC | Status: AC
  Administered 2023-01-24 (×3): 1 [drp] via OPHTHALMIC

## 2023-01-24 MED ORDER — BSS IO SOLN
INTRAOCULAR | Status: DC | PRN
  Administered 2023-01-24: 15 mL via INTRAOCULAR

## 2023-01-24 MED ORDER — TROPICAMIDE 1 % OP SOLN
1.0000 [drp] | OPHTHALMIC | Status: AC
  Administered 2023-01-24 (×3): 1 [drp] via OPHTHALMIC

## 2023-01-24 MED ORDER — PHENYLEPHRINE-KETOROLAC 1-0.3 % IO SOLN
INTRAOCULAR | Status: DC | PRN
  Administered 2023-01-24: 500 mL via OPHTHALMIC

## 2023-01-24 MED ORDER — FENTANYL CITRATE (PF) 100 MCG/2ML IJ SOLN
INTRAMUSCULAR | Status: AC
  Filled 2023-01-24: qty 2

## 2023-01-24 MED ORDER — TRYPAN BLUE 0.06 % IO SOSY
PREFILLED_SYRINGE | INTRAOCULAR | Status: DC | PRN
  Administered 2023-01-24: .5 mL via INTRAOCULAR

## 2023-01-24 MED ORDER — FENTANYL CITRATE (PF) 100 MCG/2ML IJ SOLN
INTRAMUSCULAR | Status: DC | PRN
  Administered 2023-01-24: 25 ug via INTRAVENOUS
  Administered 2023-01-24: 50 ug via INTRAVENOUS

## 2023-01-24 MED ORDER — STERILE WATER FOR IRRIGATION IR SOLN
Status: DC | PRN
  Administered 2023-01-24: 1

## 2023-01-24 MED ORDER — POVIDONE-IODINE 5 % OP SOLN
OPHTHALMIC | Status: DC | PRN
  Administered 2023-01-24: 1 via OPHTHALMIC

## 2023-01-24 MED ORDER — LIDOCAINE HCL 3.5 % OP GEL
1.0000 | Freq: Once | OPHTHALMIC | Status: AC
  Administered 2023-01-24: 1 via OPHTHALMIC

## 2023-01-24 MED ORDER — MIDAZOLAM HCL 2 MG/2ML IJ SOLN
INTRAMUSCULAR | Status: DC | PRN
  Administered 2023-01-24: 1 mg via INTRAVENOUS

## 2023-01-24 MED ORDER — SODIUM CHLORIDE 0.9% FLUSH
INTRAVENOUS | Status: DC | PRN
  Administered 2023-01-24: 3 mL via INTRAVENOUS

## 2023-01-24 MED ORDER — TETRACAINE HCL 0.5 % OP SOLN
1.0000 [drp] | OPHTHALMIC | Status: AC
  Administered 2023-01-24 (×3): 1 [drp] via OPHTHALMIC

## 2023-01-24 MED ORDER — LACTATED RINGERS IV SOLN: INTRAVENOUS | Status: DC

## 2023-01-24 SURGICAL SUPPLY — 14 items
CATARACT SUITE SIGHTPATH (MISCELLANEOUS) ×1
CLOTH BEACON ORANGE TIMEOUT ST (SAFETY) ×1 IMPLANT
DRSG TEGADERM 4X4.75 (GAUZE/BANDAGES/DRESSINGS) ×1 IMPLANT
EYE SHIELD UNIVERSAL CLEAR (GAUZE/BANDAGES/DRESSINGS) IMPLANT
FEE CATARACT SUITE SIGHTPATH (MISCELLANEOUS) ×1 IMPLANT
GLOVE BIOGEL PI IND STRL 7.0 (GLOVE) ×2 IMPLANT
LENS IOL TECNIS EYHANCE 18.0 (Intraocular Lens) IMPLANT
NDL HYPO 18GX1.5 BLUNT FILL (NEEDLE) ×1 IMPLANT
NEEDLE HYPO 18GX1.5 BLUNT FILL (NEEDLE) ×1
PAD ARMBOARD 7.5X6 YLW CONV (MISCELLANEOUS) ×1 IMPLANT
POSITIONER HEAD 8X9X4 ADT (SOFTGOODS) ×1 IMPLANT
SYR TB 1ML LL NO SAFETY (SYRINGE) ×1 IMPLANT
TAPE SURG TRANSPORE 1 IN (GAUZE/BANDAGES/DRESSINGS) IMPLANT
WATER STERILE IRR 250ML POUR (IV SOLUTION) ×1 IMPLANT

## 2023-01-24 NOTE — Transfer of Care (Signed)
Immediate Anesthesia Transfer of Care Note  Patient: Janet Short  Procedure(s) Performed: CATARACT EXTRACTION PHACO AND INTRAOCULAR LENS PLACEMENT (IOC) (Right: Eye)  Patient Location: PACU and Short Stay  Anesthesia Type:MAC  Level of Consciousness: awake and patient cooperative  Airway & Oxygen Therapy: Patient Spontanous Breathing  Post-op Assessment: Report given to RN and Post -op Vital signs reviewed and stable  Post vital signs: Reviewed and stable  Last Vitals:  Vitals Value Taken Time  BP 144/73 0756  Temp 98 0756  Pulse 77 0756  Resp 18 0756  SpO2 98 0756    Last Pain:  Vitals:   01/24/23 0629  TempSrc: Oral  PainSc: 0-No pain         Complications: No notable events documented.

## 2023-01-24 NOTE — Op Note (Signed)
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 Item Serial No. Manufacturer Lot No. LRB No. Used Action  LENS TECNIS EYHANCE IOL Intraocular Lens  [id number] SIGHTPATH  Right 1 Implanted    Indications:  Mature Visually significant age-related cataract, Right Eye  Procedure:  The patient was seen and identified in the pre-operative area. The operative eye was identified and dilated.  The operative eye was marked.  Topical anesthesia was administered to the operative eye.     The patient was then to the operative suite and placed in the supine position.  A timeout was performed confirming the patient, procedure to be performed, and all other relevant information.   The patient's face was prepped and draped in the usual fashion for intra-ocular surgery.  A lid speculum was placed into the operative eye and the surgical microscope moved into place and focused.  A lack of red reflex due to a mature cataract was confirmed.  A superotemporal paracentesis was created using a 20 gauge paracentesis blade.  Vision blue was injected into the anterior chamber.  BSS mixed with Omidria, followed by 1% lidocaine was injected into the anterior chamber.  Viscoelastic was injected into the anterior chamber.  A temporal clear-corneal main wound incision was created using a 2.32mm microkeratome.  A continuous curvilinear capsulorrhexis was initiated using an irrigating cystitome and completed using capsulorrhexis  forceps.  Hydrodissection and hydrodeliniation were performed.  Viscoelastic was injected into the anterior chamber.  A phacoemulsification handpiece and a chopper as a second instrument were used to remove the nucleus and epinucleus. The irrigation/aspiration handpiece was used to remove any remaining cortical material.   The capsular bag was reinflated with viscoelastic, checked, and found to be intact. The intraocular lens was inserted into the capsular bag and dialed into place using a kuglen hook.  The irrigation/aspiration handpiece was used to remove any remaining viscoelastic.  The clear corneal wound and paracentesis wounds were then hydrated and checked with Weck-Cels to be watertight. 0.69mL of moxifloxacin was injected into the anterior chamber. The lid-speculum and drape was removed, and the patient's face was cleaned with a wet and dry 4x4. A clear shield was taped over the eye. The patient was taken to the post-operative care unit in good condition, having tolerated the procedure well.  Post-Op Instructions: The patient will follow up at [facility] for a same day post-operative evaluation and will receive all other orders and instructions.

## 2023-01-24 NOTE — Anesthesia Postprocedure Evaluation (Signed)
Anesthesia Post Note  Patient: Janet Short  Procedure(s) Performed: CATARACT EXTRACTION PHACO AND INTRAOCULAR LENS PLACEMENT (IOC) (Right: Eye)  Patient location during evaluation: Phase II Anesthesia Type: MAC Level of consciousness: awake and alert and oriented Pain management: pain level controlled Vital Signs Assessment: post-procedure vital signs reviewed and stable Respiratory status: spontaneous breathing, nonlabored ventilation and respiratory function stable Cardiovascular status: stable and blood pressure returned to baseline Postop Assessment: no apparent nausea or vomiting Anesthetic complications: no  No notable events documented.   Last Vitals:  Vitals:   01/24/23 0629 01/24/23 0755  BP: (!) 162/61 (!) 144/73  Pulse: 61 81  Resp: 14 18  Temp: 36.8 C 36.7 C  SpO2: 100% 97%    Last Pain:  Vitals:   01/24/23 0755  TempSrc: Oral  PainSc: 0-No pain                 Savaughn Karwowski C Oreste Majeed

## 2023-01-24 NOTE — Anesthesia Procedure Notes (Signed)
Date/Time: 01/24/2023 7:26 AM  Performed by: Franco Nones, CRNAPre-anesthesia Checklist: Patient identified, Emergency Drugs available, Suction available, Timeout performed and Patient being monitored Patient Re-evaluated:Patient Re-evaluated prior to induction Oxygen Delivery Method: Nasal Cannula

## 2023-01-24 NOTE — Discharge Instructions (Signed)
Please discharge patient when stable, will follow up today with Dr. Snyder at the [facility] office immediately following discharge.  Leave shield in place until visit.  All paperwork with discharge instructions will be given at the office.  [facility] Address:  [address]  [place], [place] [address]  Dr. Snyder's Phone: [phone]  

## 2023-01-24 NOTE — Interval H&P Note (Signed)
History and Physical Interval Note:  01/24/2023 7:17 AM  The H and P was reviewed and updated. The patient was examined.  No changes were found after exam.  The surgical eye was marked.  Janet Short

## 2023-01-24 NOTE — Anesthesia Preprocedure Evaluation (Signed)
Anesthesia Evaluation  Patient identified by MRN, date of birth, ID band Patient awake    Reviewed: Allergy & Precautions, H&P , NPO status , Patient's Chart, lab work & pertinent test results  Airway Mallampati: II  TM Distance: >3 FB Neck ROM: Full    Dental  (+) Dental Advisory Given, Missing   Pulmonary neg pulmonary ROS   Pulmonary exam normal breath sounds clear to auscultation       Cardiovascular hypertension, Pt. on medications Normal cardiovascular exam Rhythm:Regular Rate:Normal     Neuro/Psych negative neurological ROS  negative psych ROS   GI/Hepatic negative GI ROS, Neg liver ROS,,,  Endo/Other  diabetes, Well Controlled, Type 2, Oral Hypoglycemic Agents    Renal/GU negative Renal ROS  negative genitourinary   Musculoskeletal negative musculoskeletal ROS (+)    Abdominal   Peds negative pediatric ROS (+)  Hematology  (+) Blood dyscrasia, anemia   Anesthesia Other Findings   Reproductive/Obstetrics negative OB ROS                             Anesthesia Physical Anesthesia Plan  ASA: 2  Anesthesia Plan: MAC   Post-op Pain Management: Minimal or no pain anticipated   Induction: Intravenous  PONV Risk Score and Plan: Treatment may vary due to age or medical condition  Airway Management Planned: Nasal Cannula and Natural Airway  Additional Equipment:   Intra-op Plan:   Post-operative Plan:   Informed Consent: I have reviewed the patients History and Physical, chart, labs and discussed the procedure including the risks, benefits and alternatives for the proposed anesthesia with the patient or authorized representative who has indicated his/her understanding and acceptance.     Dental advisory given  Plan Discussed with: CRNA and Surgeon  Anesthesia Plan Comments:         Anesthesia Quick Evaluation

## 2023-01-27 ENCOUNTER — Encounter (HOSPITAL_COMMUNITY): Payer: Self-pay | Admitting: Optometry

## 2023-02-06 ENCOUNTER — Ambulatory Visit (INDEPENDENT_AMBULATORY_CARE_PROVIDER_SITE_OTHER): Payer: 59 | Admitting: Internal Medicine

## 2023-02-06 ENCOUNTER — Encounter: Payer: Self-pay | Admitting: Internal Medicine

## 2023-02-06 VITALS — BP 173/99 | HR 86 | Ht 61.0 in | Wt 191.2 lb

## 2023-02-06 DIAGNOSIS — I10 Essential (primary) hypertension: Secondary | ICD-10-CM | POA: Diagnosis not present

## 2023-02-06 DIAGNOSIS — E1169 Type 2 diabetes mellitus with other specified complication: Secondary | ICD-10-CM

## 2023-02-06 DIAGNOSIS — E1136 Type 2 diabetes mellitus with diabetic cataract: Secondary | ICD-10-CM

## 2023-02-06 DIAGNOSIS — R21 Rash and other nonspecific skin eruption: Secondary | ICD-10-CM

## 2023-02-06 DIAGNOSIS — Z1329 Encounter for screening for other suspected endocrine disorder: Secondary | ICD-10-CM | POA: Diagnosis not present

## 2023-02-06 DIAGNOSIS — E785 Hyperlipidemia, unspecified: Secondary | ICD-10-CM

## 2023-02-06 DIAGNOSIS — E1139 Type 2 diabetes mellitus with other diabetic ophthalmic complication: Secondary | ICD-10-CM | POA: Insufficient documentation

## 2023-02-06 DIAGNOSIS — Z7984 Long term (current) use of oral hypoglycemic drugs: Secondary | ICD-10-CM

## 2023-02-06 DIAGNOSIS — E119 Type 2 diabetes mellitus without complications: Secondary | ICD-10-CM | POA: Diagnosis not present

## 2023-02-06 DIAGNOSIS — Z1321 Encounter for screening for nutritional disorder: Secondary | ICD-10-CM

## 2023-02-06 MED ORDER — AMLODIPINE BESYLATE 5 MG PO TABS: 5.0000 mg | ORAL_TABLET | Freq: Every day | ORAL | 1 refills | Status: DC

## 2023-02-06 NOTE — Progress Notes (Signed)
New Patient Office Visit  Subjective    Patient ID: Janet Short, female    DOB: 24-Nov-1950  Age: 72 y.o. MRN: [id number]  CC:  Chief Complaint  Patient presents with   Establish Care    HPI Janet Short presents to establish care.  She is a 72 year old woman who endorses a past medical history significant for HTN, HLD, and T2DM.  Previously followed by Dr. Sudie Bailey.  [name] reports feeling fairly well today.  She is asymptomatic and has no acute concerns to discuss aside from desiring to establish care.  She is a retired Arboriculturist.  She denies tobacco and illicit drug use, and endorses occasional alcohol consumption.  Her family medical history is significant for diabetes mellitus and breast cancer in her sister.  Chronic medical conditions and outstanding preventative care items discussed today are individually addressed in A/P below.   Outpatient Encounter Medications as of 02/06/2023  Medication Sig   amLODipine (NORVASC) 5 MG tablet Take 1 tablet (5 mg total) by mouth daily.   aspirin EC 81 MG tablet Take 81 mg by mouth daily.   atorvastatin (LIPITOR) 20 MG tablet Take 20 mg by mouth every evening.    bisoprolol-hydrochlorothiazide (ZIAC) 10-6.25 MG per tablet Take 1 tablet by mouth daily.   cholecalciferol (VITAMIN D) 1000 units tablet Take 1 tablet by mouth daily.    ibuprofen (ADVIL,MOTRIN) 600 MG tablet Take 600 mg by mouth every 8 (eight) hours as needed for mild pain or moderate pain.    lisinopril (PRINIVIL,ZESTRIL) 20 MG tablet Take 1 tablet (20 mg total) by mouth daily.   metFORMIN (GLUCOPHAGE) 500 MG tablet Take 1 tablet (500 mg total) by mouth 2 (two) times daily with a meal.   [DISCONTINUED] amLODipine (NORVASC) 2.5 MG tablet Take 2.5 mg by mouth daily.   [DISCONTINUED] clindamycin (CLEOCIN) 300 MG capsule Take 300 mg by mouth 4 (four) times daily. Take two capsules as initial dose. The following day, take one capsule 4 times daily until gone (#29 Capsules  prescribed)   No facility-administered encounter medications on file as of 02/06/2023.    Past Medical History:  Diagnosis Date   Anemia    Diabetes mellitus    Hypertension     Past Surgical History:  Procedure Laterality Date   ABDOMINAL HYSTERECTOMY     CATARACT EXTRACTION W/PHACO Left 01/10/2023   Procedure: CATARACT EXTRACTION PHACO AND INTRAOCULAR LENS PLACEMENT (IOC);  Surgeon: Pecolia Ades, MD;  Location: [facility];  Service: Ophthalmology;  Laterality: Left;  CDE 5.64   CATARACT EXTRACTION W/PHACO Right 01/24/2023   Procedure: CATARACT EXTRACTION PHACO AND INTRAOCULAR LENS PLACEMENT (IOC);  Surgeon: Pecolia Ades, MD;  Location: [facility];  Service: Ophthalmology;  Laterality: Right;  CDE 6.19   COLONOSCOPY N/A 05/19/2014   Procedure: COLONOSCOPY;  Surgeon: Malissa Hippo, MD;  Location: [facility];  Service: Endoscopy;  Laterality: N/A;  730 - moved to 12/3 @ 10:30 Ann to notify pt    History reviewed. No pertinent family history.  Social History   Socioeconomic History   Marital status: Married    Spouse name: Not on file   Number of children: Not on file   Years of education: Not on file   Highest education level: Not on file  Occupational History   Not on file  Tobacco Use   Smoking status: Never   Smokeless tobacco: Never  Substance and Sexual Activity   Alcohol use: Yes    Comment: rare  Drug use: No   Sexual activity: Not on file  Other Topics Concern   Not on file  Social History Narrative   Not on file   Social Determinants of Health   Financial Resource Strain: Not on file  Food Insecurity: Not on file  Transportation Needs: Not on file  Physical Activity: Not on file  Stress: Not on file  Social Connections: Not on file  Intimate Partner Violence: Not on file   Review of Systems  Constitutional:  Negative for chills and fever.  HENT:  Negative for sore throat.   Respiratory:  Negative for cough and shortness of breath.    Cardiovascular:  Negative for chest pain, palpitations and leg swelling.  Gastrointestinal:  Negative for abdominal pain, blood in stool, constipation, diarrhea, nausea and vomiting.  Genitourinary:  Negative for dysuria and hematuria.  Musculoskeletal:  Negative for myalgias.  Skin:  Negative for itching and rash.  Neurological:  Negative for dizziness and headaches.  Psychiatric/Behavioral:  Negative for depression and suicidal ideas.    Objective    BP (!) 173/99   Pulse 86   Ht 5\' 1"  (1.549 m)   Wt 191 lb 3.2 oz (86.7 kg)   SpO2 96%   BMI 36.13 kg/m   Physical Exam Vitals reviewed.  Constitutional:      General: She is not in acute distress.    Appearance: Normal appearance. She is obese. She is not toxic-appearing.  HENT:     Head: Normocephalic and atraumatic.     Right Ear: External ear normal.     Left Ear: External ear normal.     Nose: Nose normal. No congestion or rhinorrhea.     Mouth/Throat:     Mouth: Mucous membranes are moist.     Pharynx: Oropharynx is clear. No oropharyngeal exudate or posterior oropharyngeal erythema.  Eyes:     General: No scleral icterus.    Extraocular Movements: Extraocular movements intact.     Conjunctiva/sclera: Conjunctivae normal.     Pupils: Pupils are equal, round, and reactive to light.  Cardiovascular:     Rate and Rhythm: Normal rate and regular rhythm.     Pulses: Normal pulses.     Heart sounds: Normal heart sounds. No murmur heard.    No friction rub. No gallop.  Pulmonary:     Effort: Pulmonary effort is normal.     Breath sounds: Normal breath sounds. No wheezing, rhonchi or rales.  Abdominal:     General: Abdomen is flat. Bowel sounds are normal. There is no distension.     Palpations: Abdomen is soft.     Tenderness: There is no abdominal tenderness.  Musculoskeletal:        General: No swelling. Normal range of motion.     Cervical back: Normal range of motion.     Right lower leg: No edema.     Left lower  leg: No edema.  Lymphadenopathy:     Cervical: No cervical adenopathy.  Skin:    General: Skin is warm and dry.     Capillary Refill: Capillary refill takes less than 2 seconds.     Coloration: Skin is not jaundiced.     Findings: Rash (Hyperpigmented facial rash present on both cheeks) present.  Neurological:     General: No focal deficit present.     Mental Status: She is alert and oriented to person, place, and time.  Psychiatric:        Mood and Affect: Mood normal.  Behavior: Behavior normal.    Assessment & Plan:   Problem List Items Addressed This Visit       Essential hypertension    Her current antihypertensive regimen consists of amlodipine 2.5 mg daily, bisoprolol-HCTZ 10-6.25 mg daily, and lisinopril 20 mg daily.  BP is elevated today, 168/102 initially and 173/99 on repeat. -Increase amlodipine 5 mg daily      Hyperlipidemia associated with type 2 diabetes mellitus (HCC)    No previous lipid panel available for review.  She is currently prescribed atorvastatin 20 mg daily. -Repeat lipid panel ordered today      Type 2 diabetes mellitus with ophthalmic complication (HCC)    No recent A1c on file.  Patient recently underwent cataract surgery.  She is currently prescribed metformin 500 mg twice daily. -Repeat A1c and urine microalbumin/creatinine ratio ordered today      Facial rash    Dermatology referral placed at patient's request      Return in about 3 months (around 05/09/2023).   Billie Lade, MD

## 2023-02-06 NOTE — Assessment & Plan Note (Signed)
Dermatology referral placed at patient's request

## 2023-02-06 NOTE — Assessment & Plan Note (Signed)
No recent A1c on file.  Patient recently underwent cataract surgery.  She is currently prescribed metformin 500 mg twice daily. -Repeat A1c and urine microalbumin/creatinine ratio ordered today

## 2023-02-06 NOTE — Patient Instructions (Addendum)
It was a pleasure to see you today.  Thank you for giving [place] the opportunity to be involved in your care.  Below is a brief recap of your visit and next steps.  We will plan to see you again in 3 months.  Summary You have established care today We will check basic labs Increase amlodipine to 5 mg daily Continue additional medications as currently prescribed Follow up in 3 months    Schedule your Medicare Annual Wellness Visit at checkout.

## 2023-02-06 NOTE — Assessment & Plan Note (Signed)
No previous lipid panel available for review.  She is currently prescribed atorvastatin 20 mg daily. -Repeat lipid panel ordered today

## 2023-02-06 NOTE — Assessment & Plan Note (Signed)
Her current antihypertensive regimen consists of amlodipine 2.5 mg daily, bisoprolol-HCTZ 10-6.25 mg daily, and lisinopril 20 mg daily.  BP is elevated today, 168/102 initially and 173/99 on repeat. -Increase amlodipine 5 mg daily

## 2023-02-08 LAB — CMP14+EGFR
ALT: 21 IU/L (ref 0–32)
AST: 20 IU/L (ref 0–40)
Albumin: 4.3 g/dL (ref 3.8–4.8)
Alkaline Phosphatase: 58 IU/L (ref 44–121)
BUN/Creatinine Ratio: 8 — ABNORMAL LOW (ref 12–28)
BUN: 7 mg/dL — ABNORMAL LOW (ref 8–27)
Bilirubin Total: 0.3 mg/dL (ref 0.0–1.2)
CO2: 24 mmol/L (ref 20–29)
Calcium: 9.6 mg/dL (ref 8.7–10.3)
Chloride: 98 mmol/L (ref 96–106)
Creatinine, Ser: 0.87 mg/dL (ref 0.57–1.00)
Globulin, Total: 2.7 g/dL (ref 1.5–4.5)
Glucose: 133 mg/dL — ABNORMAL HIGH (ref 70–99)
Potassium: 3.9 mmol/L (ref 3.5–5.2)
Sodium: 139 mmol/L (ref 134–144)
Total Protein: 7 g/dL (ref 6.0–8.5)
eGFR: 70 mL/min/{1.73_m2} (ref >59)

## 2023-02-08 LAB — CBC WITH DIFFERENTIAL/PLATELET
Basophils Absolute: 0 10*3/uL (ref 0.0–0.2)
Basos: 0 %
EOS (ABSOLUTE): 0.2 10*3/uL (ref 0.0–0.4)
Eos: 3 %
Hematocrit: 40.2 % (ref 34.0–46.6)
Hemoglobin: 13.1 g/dL (ref 11.1–15.9)
Immature Grans (Abs): 0 10*3/uL (ref 0.0–0.1)
Immature Granulocytes: 0 %
Lymphocytes Absolute: 1.6 10*3/uL (ref 0.7–3.1)
Lymphs: 27 %
MCH: 27.2 pg (ref 26.6–33.0)
MCHC: 32.6 g/dL (ref 31.5–35.7)
MCV: 83 fL (ref 79–97)
Monocytes Absolute: 0.4 10*3/uL (ref 0.1–0.9)
Monocytes: 8 %
Neutrophils Absolute: 3.5 10*3/uL (ref 1.4–7.0)
Neutrophils: 62 %
Platelets: 216 10*3/uL (ref 150–450)
RBC: 4.82 x10E6/uL (ref 3.77–5.28)
RDW: 13 % (ref 11.7–15.4)
WBC: 5.8 10*3/uL (ref 3.4–10.8)

## 2023-02-08 LAB — LIPID PANEL
Chol/HDL Ratio: 2.5 ratio (ref 0.0–4.4)
Cholesterol, Total: 131 mg/dL (ref 100–199)
HDL: 53 mg/dL (ref >39)
LDL Chol Calc (NIH): 55 mg/dL (ref 0–99)
Triglycerides: 135 mg/dL (ref 0–149)
VLDL Cholesterol Cal: 23 mg/dL (ref 5–40)

## 2023-02-08 LAB — B12 AND FOLATE PANEL
Folate: 13 ng/mL (ref >3.0)
Vitamin B-12: 360 pg/mL (ref 232–1245)

## 2023-02-08 LAB — VITAMIN D 25 HYDROXY (VIT D DEFICIENCY, FRACTURES): Vit D, 25-Hydroxy: 54.4 ng/mL (ref 30.0–100.0)

## 2023-02-08 LAB — HEMOGLOBIN A1C
Est. average glucose Bld gHb Est-mCnc: 160 mg/dL
Hgb A1c MFr Bld: 7.2 % — ABNORMAL HIGH (ref 4.8–5.6)

## 2023-02-08 LAB — TSH+FREE T4
Free T4: 1.35 ng/dL (ref 0.82–1.77)
TSH: 0.842 u[IU]/mL (ref 0.450–4.500)

## 2023-02-08 LAB — MICROALBUMIN / CREATININE URINE RATIO
Creatinine, Urine: 50.6 mg/dL
Microalb/Creat Ratio: 49 mg/g{creat} — ABNORMAL HIGH (ref 0–29)
Microalbumin, Urine: 24.7 ug/mL

## 2023-02-10 ENCOUNTER — Telehealth: Payer: Self-pay | Admitting: Internal Medicine

## 2023-02-10 NOTE — Telephone Encounter (Signed)
Patient calling says she's returning a call regarding her medication

## 2023-02-11 NOTE — Telephone Encounter (Signed)
Spoke with patient about medications. 

## 2023-05-09 ENCOUNTER — Encounter: Payer: Self-pay | Admitting: Internal Medicine

## 2023-05-09 ENCOUNTER — Ambulatory Visit (INDEPENDENT_AMBULATORY_CARE_PROVIDER_SITE_OTHER): Payer: 59 | Admitting: Internal Medicine

## 2023-05-09 ENCOUNTER — Ambulatory Visit: Payer: 59 | Admitting: Internal Medicine

## 2023-05-09 VITALS — BP 163/94 | HR 84 | Ht 61.0 in | Wt 193.4 lb

## 2023-05-09 DIAGNOSIS — I10 Essential (primary) hypertension: Secondary | ICD-10-CM

## 2023-05-09 DIAGNOSIS — E1169 Type 2 diabetes mellitus with other specified complication: Secondary | ICD-10-CM | POA: Diagnosis not present

## 2023-05-09 DIAGNOSIS — E1129 Type 2 diabetes mellitus with other diabetic kidney complication: Secondary | ICD-10-CM | POA: Diagnosis not present

## 2023-05-09 DIAGNOSIS — E1136 Type 2 diabetes mellitus with diabetic cataract: Secondary | ICD-10-CM

## 2023-05-09 DIAGNOSIS — R809 Proteinuria, unspecified: Secondary | ICD-10-CM

## 2023-05-09 DIAGNOSIS — E785 Hyperlipidemia, unspecified: Secondary | ICD-10-CM

## 2023-05-09 DIAGNOSIS — Z7984 Long term (current) use of oral hypoglycemic drugs: Secondary | ICD-10-CM

## 2023-05-09 MED ORDER — EMPAGLIFLOZIN 10 MG PO TABS: 10.0000 mg | ORAL_TABLET | Freq: Every day | ORAL | 2 refills | Status: DC

## 2023-05-09 MED ORDER — AMLODIPINE BESYLATE 10 MG PO TABS: 10.0000 mg | ORAL_TABLET | Freq: Every day | ORAL | 1 refills | Status: DC

## 2023-05-09 NOTE — Assessment & Plan Note (Signed)
BP remains elevated today.  Amlodipine was increased to 5 mg daily at her last appointment.  She is additionally prescribed bisoprolol-HCTZ 10-6.25 mg daily and lisinopril 20 mg daily. -Increase amlodipine to 10 mg daily.  Continue bisoprolol-HCTZ and lisinopril as they are currently prescribed.  Nurse visit for BP check in 4 weeks.

## 2023-05-09 NOTE — Progress Notes (Signed)
Established Patient Office Visit  Subjective   Patient ID: Janet Short, female    DOB: 1951-06-05  Age: 72 y.o. MRN: [id number]  Chief Complaint  Patient presents with   Diabetes    Three month follow up   Janet Short returns to care today for routine follow-up.  She was last evaluated by me on 8/22 as a new patient presenting to establish care.  Her blood pressure was significantly elevated at that time and amlodipine was increased to 5 mg daily.  Baseline labs were ordered and 7-month follow-up was arranged for reassessment.  There have been no acute interval events. Janet Short reports feeling well today.  She is asymptomatic and has no acute concerns to discuss.  Past Medical History:  Diagnosis Date   Anemia    Diabetes mellitus    Hypertension    Past Surgical History:  Procedure Laterality Date   ABDOMINAL HYSTERECTOMY     CATARACT EXTRACTION W/PHACO Left 01/10/2023   Procedure: CATARACT EXTRACTION PHACO AND INTRAOCULAR LENS PLACEMENT (IOC);  Surgeon: Pecolia Ades, MD;  Location: [facility];  Service: Ophthalmology;  Laterality: Left;  CDE 5.64   CATARACT EXTRACTION W/PHACO Right 01/24/2023   Procedure: CATARACT EXTRACTION PHACO AND INTRAOCULAR LENS PLACEMENT (IOC);  Surgeon: Pecolia Ades, MD;  Location: [facility];  Service: Ophthalmology;  Laterality: Right;  CDE 6.19   COLONOSCOPY N/A 05/19/2014   Procedure: COLONOSCOPY;  Surgeon: Malissa Hippo, MD;  Location: [facility];  Service: Endoscopy;  Laterality: N/A;  730 - moved to 12/3 @ 10:30 Ann to notify pt   Social History   Tobacco Use   Smoking status: Never   Smokeless tobacco: Never  Substance Use Topics   Alcohol use: Yes    Comment: rare   Drug use: No   No family history on file. No Known Allergies  Review of Systems  Constitutional:  Negative for chills and fever.  HENT:  Negative for sore throat.   Respiratory:  Negative for cough and shortness of breath.   Cardiovascular:  Negative for chest  pain, palpitations and leg swelling.  Gastrointestinal:  Negative for abdominal pain, blood in stool, constipation, diarrhea, nausea and vomiting.  Genitourinary:  Negative for dysuria and hematuria.  Musculoskeletal:  Negative for myalgias.  Skin:  Negative for itching and rash.  Neurological:  Negative for dizziness and headaches.  Psychiatric/Behavioral:  Negative for depression and suicidal ideas.      Objective:     BP (!) 163/94 (BP Location: Right Arm, Patient Position: Sitting, Cuff Size: Large)   Pulse 84   Ht 5\' 1"  (1.549 m)   Wt 193 lb 6.4 oz (87.7 kg)   SpO2 96%   BMI 36.54 kg/m  BP Readings from Last 3 Encounters:  05/09/23 (!) 163/94  02/06/23 (!) 173/99  01/24/23 (!) 144/73   Physical Exam Vitals reviewed.  Constitutional:      General: She is not in acute distress.    Appearance: Normal appearance. She is obese. She is not toxic-appearing.  HENT:     Head: Normocephalic and atraumatic.     Right Ear: External ear normal.     Left Ear: External ear normal.     Nose: Nose normal. No congestion or rhinorrhea.     Mouth/Throat:     Mouth: Mucous membranes are moist.     Pharynx: Oropharynx is clear. No oropharyngeal exudate or posterior oropharyngeal erythema.  Eyes:     General: No scleral icterus.  Extraocular Movements: Extraocular movements intact.     Conjunctiva/sclera: Conjunctivae normal.     Pupils: Pupils are equal, round, and reactive to light.  Cardiovascular:     Rate and Rhythm: Normal rate and regular rhythm.     Pulses: Normal pulses.     Heart sounds: Normal heart sounds. No murmur heard.    No friction rub. No gallop.  Pulmonary:     Effort: Pulmonary effort is normal.     Breath sounds: Normal breath sounds. No wheezing, rhonchi or rales.  Abdominal:     General: Abdomen is flat. Bowel sounds are normal. There is no distension.     Palpations: Abdomen is soft.     Tenderness: There is no abdominal tenderness.  Musculoskeletal:         General: No swelling. Normal range of motion.     Cervical back: Normal range of motion.     Right lower leg: No edema.     Left lower leg: No edema.  Lymphadenopathy:     Cervical: No cervical adenopathy.  Skin:    General: Skin is warm and dry.     Capillary Refill: Capillary refill takes less than 2 seconds.     Coloration: Skin is not jaundiced.     Findings: Rash (Hyperpigmented facial rash present on both cheeks) present.  Neurological:     General: No focal deficit present.     Mental Status: She is alert and oriented to person, place, and time.  Psychiatric:        Mood and Affect: Mood normal.        Behavior: Behavior normal.   Last CBC Lab Results  Component Value Date   WBC 5.8 02/06/2023   HGB 13.1 02/06/2023   HCT 40.2 02/06/2023   MCV 83 02/06/2023   MCH 27.2 02/06/2023   RDW 13.0 02/06/2023   PLT 216 02/06/2023   Last metabolic panel Lab Results  Component Value Date   GLUCOSE 133 (H) 02/06/2023   NA 139 02/06/2023   K 3.9 02/06/2023   CL 98 02/06/2023   CO2 24 02/06/2023   BUN 7 (L) 02/06/2023   CREATININE 0.87 02/06/2023   EGFR 70 02/06/2023   CALCIUM 9.6 02/06/2023   PROT 7.0 02/06/2023   ALBUMIN 4.3 02/06/2023   LABGLOB 2.7 02/06/2023   BILITOT 0.3 02/06/2023   ALKPHOS 58 02/06/2023   AST 20 02/06/2023   ALT 21 02/06/2023   ANIONGAP 11 02/20/2018   Last lipids Lab Results  Component Value Date   CHOL 131 02/06/2023   HDL 53 02/06/2023   LDLCALC 55 02/06/2023   TRIG 135 02/06/2023   CHOLHDL 2.5 02/06/2023   Last hemoglobin A1c Lab Results  Component Value Date   HGBA1C 7.2 (H) 02/06/2023   Last thyroid functions Lab Results  Component Value Date   TSH 0.842 02/06/2023   Last vitamin D Lab Results  Component Value Date   VD25OH 54.4 02/06/2023   Last vitamin B12 and Folate Lab Results  Component Value Date   VITAMINB12 360 02/06/2023   FOLATE 13.0 02/06/2023   The 10-year ASCVD risk score (Arnett DK, et al., 2019)  is: 30.2%    Assessment & Plan:   Problem List Items Addressed This Visit       Essential hypertension    BP remains elevated today.  Amlodipine was increased to 5 mg daily at her last appointment.  She is additionally prescribed bisoprolol-HCTZ 10-6.25 mg daily and lisinopril 20 mg daily. -  Increase amlodipine to 10 mg daily.  Continue bisoprolol-HCTZ and lisinopril as they are currently prescribed.  Nurse visit for BP check in 4 weeks.      Hyperlipidemia associated with type 2 diabetes mellitus (HCC)    Adequately controlled with atorvastatin 20 mg daily.  LDL 55 on lipid panel from August.      Type 2 diabetes mellitus with ophthalmic complication (HCC) - Primary    A1c 7.2 on labs from August.  She is currently prescribed metformin 500 mg twice daily. -Add Jardiance 10 mg daily in the setting of microalbuminuria.  Continue metformin as currently prescribed. -Repeat A1c at follow-up in 3 months.      Microalbuminuria due to type 2 diabetes mellitus (HCC)    Moderately increased urine microalbumin/creatinine ratio noted on urine study from August.  Currently on lisinopril.  Will add Jardiance as well.      Return in about 3 months (around 08/09/2023).   Billie Lade, MD

## 2023-05-09 NOTE — Assessment & Plan Note (Signed)
A1c 7.2 on labs from August.  She is currently prescribed metformin 500 mg twice daily. -Add Jardiance 10 mg daily in the setting of microalbuminuria.  Continue metformin as currently prescribed. -Repeat A1c at follow-up in 3 months.

## 2023-05-09 NOTE — Assessment & Plan Note (Signed)
Moderately increased urine microalbumin/creatinine ratio noted on urine study from August.  Currently on lisinopril.  Will add Jardiance as well.

## 2023-05-09 NOTE — Assessment & Plan Note (Signed)
Adequately controlled with atorvastatin 20 mg daily.  LDL 55 on lipid panel from August.

## 2023-05-09 NOTE — Patient Instructions (Signed)
It was a pleasure to see you today.  Thank you for giving [place] the opportunity to be involved in your care.  Below is a brief recap of your visit and next steps.  We will plan to see you again in 3 months.  Summary Increase amlodipine to 10 mg daily Add jardiance 10 mg daily Follow up in 3 months  Nurse visit for BP check in 4 weeks

## 2023-06-06 ENCOUNTER — Ambulatory Visit: Payer: 59

## 2023-06-06 NOTE — Care Management Note (Signed)
Pt. Entered clinic for nurse visit to check B/P.   Vitals for this visit has been entered into the system. Pt brought her log for the last 27 days starting on 05/09/2023-06/06/23  11/22-148/70 11/23-122/71 11/24-113/76 11/25-116/68 11/26-123/79 11/27-123/66 11/28-117/67 11/29-122/73 11/30-120/69 12-01-129/76 12/02-121/71 12/03-103/70 12/04-117/71 12/05-122/70 12/06-110/67 12/07-106/63 12/08-116/68 12/09-109/67 12/10-115/67 12/11-116/65 12/12-119/68 12/13-120/74 12/14-113/70 12/15-116/69 12/16-110/66 12/17-110/66 12/18-119/73 12/19/134/75 12/20141/79

## 2023-08-06 ENCOUNTER — Other Ambulatory Visit: Payer: Self-pay | Admitting: Internal Medicine

## 2023-08-06 DIAGNOSIS — E1129 Type 2 diabetes mellitus with other diabetic kidney complication: Secondary | ICD-10-CM

## 2023-08-06 DIAGNOSIS — E1136 Type 2 diabetes mellitus with diabetic cataract: Secondary | ICD-10-CM

## 2023-08-07 ENCOUNTER — Ambulatory Visit: Payer: Self-pay | Admitting: Internal Medicine

## 2023-09-09 ENCOUNTER — Telehealth: Payer: Self-pay | Admitting: Internal Medicine

## 2023-09-09 NOTE — Telephone Encounter (Signed)
 Called to scheduled diabetic eye exam in our office, patient goes to my eye dr Dr Carita Pian

## 2023-09-23 ENCOUNTER — Ambulatory Visit: Payer: 59 | Admitting: Internal Medicine

## 2023-10-25 ENCOUNTER — Other Ambulatory Visit: Payer: Self-pay | Admitting: Internal Medicine

## 2023-10-25 DIAGNOSIS — I10 Essential (primary) hypertension: Secondary | ICD-10-CM

## 2023-11-14 ENCOUNTER — Ambulatory Visit: Admitting: Internal Medicine

## 2023-11-14 ENCOUNTER — Encounter: Payer: Self-pay | Admitting: Internal Medicine

## 2023-11-14 VITALS — BP 132/82 | HR 81 | Ht 61.0 in | Wt 185.8 lb

## 2023-11-14 DIAGNOSIS — E1129 Type 2 diabetes mellitus with other diabetic kidney complication: Secondary | ICD-10-CM | POA: Diagnosis not present

## 2023-11-14 DIAGNOSIS — E1136 Type 2 diabetes mellitus with diabetic cataract: Secondary | ICD-10-CM | POA: Diagnosis not present

## 2023-11-14 DIAGNOSIS — M79644 Pain in right finger(s): Secondary | ICD-10-CM | POA: Diagnosis not present

## 2023-11-14 DIAGNOSIS — R809 Proteinuria, unspecified: Secondary | ICD-10-CM

## 2023-11-14 DIAGNOSIS — Z1231 Encounter for screening mammogram for malignant neoplasm of breast: Secondary | ICD-10-CM | POA: Insufficient documentation

## 2023-11-14 DIAGNOSIS — I10 Essential (primary) hypertension: Secondary | ICD-10-CM | POA: Diagnosis not present

## 2023-11-14 MED ORDER — EMPAGLIFLOZIN 10 MG PO TABS
10.0000 mg | ORAL_TABLET | Freq: Every day | ORAL | 3 refills | Status: DC
Start: 2023-11-14 — End: 2024-03-01

## 2023-11-14 NOTE — Assessment & Plan Note (Signed)
 Moderately increased microalbuminuria noted on urine study from August 2024.  He is currently prescribed lisinopril .  Jardiance  was added at her last appointment.  Refill provided today.

## 2023-11-14 NOTE — Progress Notes (Signed)
 Established Patient Office Visit  Subjective   Patient ID: Janet Short, female    DOB: 1950-07-01  Age: 73 y.o. MRN: [id number]  Chief Complaint  Patient presents with   Hand Pain    Finger pain , trouble bending   Diabetes    Three month follow up    [name] returns to care today for routine follow-up.  She was last evaluated by me in November 2024.  Amlodipine  was increased to 10 mg daily for improved treatment of HTN.  Jardiance  10 mg daily was added in the setting of microalbuminuria and diabetes mellitus.  49-month follow-up was arranged for reassessment.  There have been no acute interval events. [name] reports that her husband died 1 week ago.  She is understandably mourning but reports feeling fairly well today.  Her acute concern today is pain at the base of the ring finger on her right hand that has been present for the past month.  Pain is worse with flexion.  Past Medical History:  Diagnosis Date   Anemia    Diabetes mellitus    Hypertension    Past Surgical History:  Procedure Laterality Date   ABDOMINAL HYSTERECTOMY     CATARACT EXTRACTION W/PHACO Left 01/10/2023   Procedure: CATARACT EXTRACTION PHACO AND INTRAOCULAR LENS PLACEMENT (IOC);  Surgeon: Ardeth Krabbe, MD;  Location: [facility];  Service: Ophthalmology;  Laterality: Left;  CDE 5.64   CATARACT EXTRACTION W/PHACO Right 01/24/2023   Procedure: CATARACT EXTRACTION PHACO AND INTRAOCULAR LENS PLACEMENT (IOC);  Surgeon: Ardeth Krabbe, MD;  Location: [facility];  Service: Ophthalmology;  Laterality: Right;  CDE 6.19   COLONOSCOPY N/A 05/19/2014   Procedure: COLONOSCOPY;  Surgeon: Ruby Corporal, MD;  Location: [facility];  Service: Endoscopy;  Laterality: N/A;  730 - moved to 12/3 @ 10:30 Ann to notify pt   Social History   Tobacco Use   Smoking status: Never   Smokeless tobacco: Never  Substance Use Topics   Alcohol use: Yes    Comment: rare   Drug use: No   No family history on file. No Known  Allergies  Review of Systems  Constitutional:  Negative for chills and fever.  HENT:  Negative for sore throat.   Respiratory:  Negative for cough and shortness of breath.   Cardiovascular:  Negative for chest pain, palpitations and leg swelling.  Gastrointestinal:  Negative for abdominal pain, blood in stool, constipation, diarrhea, nausea and vomiting.  Genitourinary:  Negative for dysuria and hematuria.  Musculoskeletal:  Positive for joint pain (MCP 4th digit right hand). Negative for myalgias.  Skin:  Negative for itching and rash.  Neurological:  Negative for dizziness and headaches.  Psychiatric/Behavioral:  Negative for depression and suicidal ideas.      Objective:     BP 132/82   Pulse 81   Ht 5\' 1"  (1.549 m)   Wt 185 lb 12.8 oz (84.3 kg)   SpO2 96%   BMI 35.11 kg/m  BP Readings from Last 3 Encounters:  11/14/23 132/82  06/06/23 (!) 141/79  05/09/23 (!) 163/94   Physical Exam Vitals reviewed.  Constitutional:      General: She is not in acute distress.    Appearance: Normal appearance. She is obese. She is not toxic-appearing.  HENT:     Head: Normocephalic and atraumatic.     Right Ear: External ear normal.     Left Ear: External ear normal.     Nose: Nose normal. No congestion  or rhinorrhea.     Mouth/Throat:     Mouth: Mucous membranes are moist.     Pharynx: Oropharynx is clear. No oropharyngeal exudate or posterior oropharyngeal erythema.  Eyes:     General: No scleral icterus.    Extraocular Movements: Extraocular movements intact.     Conjunctiva/sclera: Conjunctivae normal.     Pupils: Pupils are equal, round, and reactive to light.  Cardiovascular:     Rate and Rhythm: Normal rate and regular rhythm.     Pulses: Normal pulses.     Heart sounds: Normal heart sounds. No murmur heard.    No friction rub. No gallop.  Pulmonary:     Effort: Pulmonary effort is normal.     Breath sounds: Normal breath sounds. No wheezing, rhonchi or rales.   Abdominal:     General: Abdomen is flat. Bowel sounds are normal. There is no distension.     Palpations: Abdomen is soft.     Tenderness: There is no abdominal tenderness.  Musculoskeletal:        General: No swelling.     Cervical back: Normal range of motion.     Right lower leg: No edema.     Left lower leg: No edema.     Comments: Pain along palmar aspect of MCP of right ring finger, worse with attempted flexion  Lymphadenopathy:     Cervical: No cervical adenopathy.  Skin:    General: Skin is warm and dry.     Capillary Refill: Capillary refill takes less than 2 seconds.     Coloration: Skin is not jaundiced.  Neurological:     General: No focal deficit present.     Mental Status: She is alert and oriented to person, place, and time.  Psychiatric:        Mood and Affect: Mood normal.        Behavior: Behavior normal.   Last CBC Lab Results  Component Value Date   WBC 5.8 02/06/2023   HGB 13.1 02/06/2023   HCT 40.2 02/06/2023   MCV 83 02/06/2023   MCH 27.2 02/06/2023   RDW 13.0 02/06/2023   PLT 216 02/06/2023   Last metabolic panel Lab Results  Component Value Date   GLUCOSE 133 (H) 02/06/2023   NA 139 02/06/2023   K 3.9 02/06/2023   CL 98 02/06/2023   CO2 24 02/06/2023   BUN 7 (L) 02/06/2023   CREATININE 0.87 02/06/2023   EGFR 70 02/06/2023   CALCIUM 9.6 02/06/2023   PROT 7.0 02/06/2023   ALBUMIN 4.3 02/06/2023   LABGLOB 2.7 02/06/2023   BILITOT 0.3 02/06/2023   ALKPHOS 58 02/06/2023   AST 20 02/06/2023   ALT 21 02/06/2023   ANIONGAP 11 02/20/2018   Last lipids Lab Results  Component Value Date   CHOL 131 02/06/2023   HDL 53 02/06/2023   LDLCALC 55 02/06/2023   TRIG 135 02/06/2023   CHOLHDL 2.5 02/06/2023   Last hemoglobin A1c Lab Results  Component Value Date   HGBA1C 7.2 (H) 02/06/2023   Last thyroid functions Lab Results  Component Value Date   TSH 0.842 02/06/2023   Last vitamin D  Lab Results  Component Value Date   VD25OH 54.4  02/06/2023   Last vitamin B12 and Folate Lab Results  Component Value Date   VITAMINB12 360 02/06/2023   FOLATE 13.0 02/06/2023   The 10-year ASCVD risk score (Arnett DK, et al., 2019) is: 22.2%    Assessment & Plan:   Problem List  Items Addressed This Visit       Essential hypertension   Adequately controlled on current antihypertensive regimen.  Amlodipine  was increased to 10 mg daily at her last appointment.  She is additionally prescribed bisoprolol -HCTZ and lisinopril .  No additional medication changes are indicated at this time.      Type 2 diabetes mellitus with ophthalmic complication (HCC)   A1c 7.2 on labs from August 2024.  She is currently prescribed metformin  1000 mg twice daily and Jardiance  10 mg daily.  Repeat A1c ordered today.      Microalbuminuria due to type 2 diabetes mellitus (HCC)   Moderately increased microalbuminuria noted on urine study from August 2024.  He is currently prescribed lisinopril .  Jardiance  was added at her last appointment.  Refill provided today.      Pain in finger of right hand   She endorsed this pain at the base of the ring finger on her right hand.  On exam there is pain along the palmar aspect of the MCP of the ring finger.  Pain is worse with flexion.  I suspect this is due to trigger finger.  She is not interested in any type of procedural intervention.  Conservative treatment measures reviewed today.  She will return if symptoms worsen or fail to improve.      Breast cancer screening by mammogram - Primary   Screening mammogram ordered today       Return in about 6 months (around 05/16/2024).    Tobi Fortes, MD

## 2023-11-14 NOTE — Assessment & Plan Note (Signed)
 A1c 7.2 on labs from August 2024.  She is currently prescribed metformin  1000 mg twice daily and Jardiance  10 mg daily.  Repeat A1c ordered today.

## 2023-11-14 NOTE — Assessment & Plan Note (Signed)
 She endorsed this pain at the base of the ring finger on her right hand.  On exam there is pain along the palmar aspect of the MCP of the ring finger.  Pain is worse with flexion.  I suspect this is due to trigger finger.  She is not interested in any type of procedural intervention.  Conservative treatment measures reviewed today.  She will return if symptoms worsen or fail to improve.

## 2023-11-14 NOTE — Assessment & Plan Note (Signed)
 Adequately controlled on current antihypertensive regimen.  Amlodipine  was increased to 10 mg daily at her last appointment.  She is additionally prescribed bisoprolol -HCTZ and lisinopril .  No additional medication changes are indicated at this time.

## 2023-11-14 NOTE — Patient Instructions (Signed)
 It was a pleasure to see you today.  Thank you for giving us  the opportunity to be involved in your care.  Below is a brief recap of your visit and next steps.  We will plan to see you again in 6 months.  Summary No medication changes today. Jardiance  refilled Repeat labs Mammogram ordered Follow up in 6 months

## 2023-11-14 NOTE — Assessment & Plan Note (Signed)
 Screening mammogram ordered today

## 2023-11-15 LAB — BASIC METABOLIC PANEL WITH GFR
BUN/Creatinine Ratio: 13 (ref 12–28)
BUN: 12 mg/dL (ref 8–27)
CO2: 22 mmol/L (ref 20–29)
Calcium: 9.7 mg/dL (ref 8.7–10.3)
Chloride: 96 mmol/L (ref 96–106)
Creatinine, Ser: 0.9 mg/dL (ref 0.57–1.00)
Glucose: 173 mg/dL — ABNORMAL HIGH (ref 70–99)
Potassium: 4.3 mmol/L (ref 3.5–5.2)
Sodium: 138 mmol/L (ref 134–144)
eGFR: 68 mL/min/{1.73_m2} (ref >59)

## 2023-11-15 LAB — HEMOGLOBIN A1C
Est. average glucose Bld gHb Est-mCnc: 163 mg/dL
Hgb A1c MFr Bld: 7.3 % — ABNORMAL HIGH (ref 4.8–5.6)

## 2023-11-17 ENCOUNTER — Ambulatory Visit: Payer: Self-pay

## 2023-11-17 ENCOUNTER — Ambulatory Visit: Payer: Self-pay | Admitting: Internal Medicine

## 2023-11-17 NOTE — Telephone Encounter (Signed)
 Pt advised with verbal unterstanding

## 2023-11-17 NOTE — Telephone Encounter (Signed)
 Patient called after receiving her lab results with the A1C mildly elevated. Pt states she has been eating fruits such as oranges, strawberries, and raspberries in the afternoon. Pt wants to know if she needs to cut these out of her diet. Pt call back number:  [phone]    Copied from CRM [id number]. Topic: Clinical - Lab/Test Results >> Nov 17, 2023  8:36 AM Turkey A wrote: Reason for CRM: Read to Patient verbatim lab results that were in message. Patient had additional questions. Called NT and warm transfere

## 2023-11-26 ENCOUNTER — Other Ambulatory Visit: Payer: Self-pay

## 2023-11-26 MED ORDER — METFORMIN HCL 500 MG PO TABS: 500.0000 mg | ORAL_TABLET | Freq: Two times a day (BID) | ORAL | 0 refills | Status: DC

## 2023-11-26 NOTE — Telephone Encounter (Signed)
 Copied from CRM [id number]. Topic: Clinical - Medication Refill >> Nov 26, 2023 12:45 PM Emylou G wrote: Medication: metFORMIN  (GLUCOPHAGE ) 500 MG tablet  Has the patient contacted their pharmacy? Yes (Agent: If no, request that the patient contact the pharmacy for the refill. If patient does not wish to contact the pharmacy document the reason why and proceed with request.) (Agent: If yes, when and what did the pharmacy advise?) not rcvd  This is the patient's preferred pharmacy:  [facility] [address], [place] - [address] [place] [address] Phone: [phone] Fax: [phone]  Is this the correct pharmacy for this prescription? Yes If no, delete pharmacy and type the correct one.   Has the prescription been filled recently? Yes  Is the patient out of the medication? Yes  Has the patient been seen for an appointment in the last year OR does the patient have an upcoming appointment? Yes  Can we respond through MyChart? No  Agent: Please be advised that Rx refills may take up to 3 business days. We ask that you follow-up with your pharmacy.

## 2023-11-27 ENCOUNTER — Other Ambulatory Visit: Payer: Self-pay

## 2023-11-27 DIAGNOSIS — E1136 Type 2 diabetes mellitus with diabetic cataract: Secondary | ICD-10-CM

## 2023-11-27 MED ORDER — METFORMIN HCL 500 MG PO TABS
500.0000 mg | ORAL_TABLET | Freq: Two times a day (BID) | ORAL | 1 refills | Status: AC
Start: 2023-11-27 — End: ?

## 2023-12-01 ENCOUNTER — Other Ambulatory Visit: Payer: Self-pay

## 2023-12-01 ENCOUNTER — Telehealth: Payer: Self-pay

## 2023-12-01 DIAGNOSIS — E1136 Type 2 diabetes mellitus with diabetic cataract: Secondary | ICD-10-CM

## 2023-12-01 MED ORDER — METFORMIN HCL 500 MG PO TABS: 1000.0000 mg | ORAL_TABLET | Freq: Two times a day (BID) | ORAL | 1 refills | Status: DC

## 2023-12-01 NOTE — Telephone Encounter (Signed)
 Copied from CRM [id number]. Topic: Clinical - Prescription Issue >> Dec 01, 2023  2:47 PM Janet Short wrote: Reason for CRM: Patient is calling because she states she usually takes 1000mg  2x a day of metFORMIN  (GLUCOPHAGE ) 500 MG tablet. She states her refill was for 500mg  2x a day. She is calling to clarify what she is supposed to be taking. Please advise per pts request.

## 2023-12-02 NOTE — Telephone Encounter (Signed)
 Pt advised with verbal understanding

## 2023-12-17 ENCOUNTER — Other Ambulatory Visit: Payer: Self-pay

## 2023-12-17 MED ORDER — ATORVASTATIN CALCIUM 20 MG PO TABS: 20.0000 mg | ORAL_TABLET | Freq: Every evening | ORAL | 0 refills | Status: DC

## 2023-12-17 MED ORDER — BISOPROLOL-HYDROCHLOROTHIAZIDE 10-6.25 MG PO TABS: 1.0000 | ORAL_TABLET | Freq: Every day | ORAL | 0 refills | Status: DC

## 2023-12-17 NOTE — Telephone Encounter (Signed)
 Copied from CRM [id number]. Topic: Clinical - Medication Refill >> Dec 17, 2023  9:42 AM Antwanette L wrote: Medication: atorvastatin (LIPITOR) 20 MG tablet and bisoprolol -hydrochlorothiazide  (ZIAC ) 10-6.25 MG per tablet  Has the patient contacted their pharmacy? Yes   This is the patient's preferred pharmacy:  [facility] [address] [place] - [address] [place] [address] Phone: [phone] Fax: [phone]  Is this the correct pharmacy for this prescription? Yes    Has the prescription been filled recently? No.  Is the patient out of the medication? Yes  Has the patient been seen for an appointment in the last year OR does the patient have an upcoming appointment? Yes. Last ov with Dr. Melvenia was on 11/14/23.  Can we respond through MyChart? No. Contact the patient by phone at [phone]  Agent: Please be advised that Rx refills may take up to 3 business days. We ask that you follow-up with your pharmacy.

## 2024-01-19 ENCOUNTER — Other Ambulatory Visit: Payer: Self-pay

## 2024-01-19 MED ORDER — BISOPROLOL-HYDROCHLOROTHIAZIDE 10-6.25 MG PO TABS: 1.0000 | ORAL_TABLET | Freq: Every day | ORAL | 0 refills | Status: DC

## 2024-01-19 NOTE — Telephone Encounter (Signed)
 Copied from CRM [id number]. Topic: Clinical - Medication Refill >> Jan 19, 2024  1:55 PM Deaijah H wrote: Medication: bisoprolol -hydrochlorothiazide  (ZIAC ) 10-6.25 MG tablet  Has the patient contacted their pharmacy? Yes (Agent: If no, request that the patient contact the pharmacy for the refill. If patient does not wish to contact the pharmacy document the reason why and proceed with request.) (Agent: If yes, when and what did the pharmacy advise?) To contact Dr.  This is the patient's preferred pharmacy:  [facility] [address] [place] - [address] [place] [address] [place] [address] Phone: [phone] Fax: [phone]  Is this the correct pharmacy for this prescription? Yes If no, delete pharmacy and type the correct one.   Has the prescription been filled recently? Yes  Is the patient out of the medication? No  Has the patient been seen for an appointment in the last year OR does the patient have an upcoming appointment? Yes  Can we respond through MyChart? No  Agent: Please be advised that Rx refills may take up to 3 business days. We ask that you follow-up with your pharmacy.

## 2024-01-21 ENCOUNTER — Other Ambulatory Visit: Payer: Self-pay

## 2024-01-21 DIAGNOSIS — I10 Essential (primary) hypertension: Secondary | ICD-10-CM

## 2024-01-21 MED ORDER — LISINOPRIL 20 MG PO TABS
20.0000 mg | ORAL_TABLET | Freq: Every day | ORAL | 1 refills | Status: AC
Start: 2024-01-21 — End: ?

## 2024-01-27 ENCOUNTER — Telehealth: Payer: Self-pay

## 2024-01-27 ENCOUNTER — Ambulatory Visit: Payer: Self-pay

## 2024-01-27 NOTE — Telephone Encounter (Signed)
 Refills sent to pharmacy.

## 2024-01-27 NOTE — Telephone Encounter (Signed)
 Prescription Request  01/27/2024  LOV: Visit date not found  What is the name of the medication or equipment? amLODipine  (NORVASC ) 10 MG tablet [[id number]]   atorvastatin  (LIPITOR) 20 MG tablet [[id number]]   bisoprolol -hydrochlorothiazide  (ZIAC ) 10-6.25 MG tablet [[id number]]   lisinopril  (ZESTRIL ) 20 MG tablet [[id number]]   metFORMIN  (GLUCOPHAGE ) 500 MG tablet [[id number]]   empagliflozin  (JARDIANCE ) 10 MG TABS tablet [[id number]]    Have you contacted your pharmacy to request a refill? Yes   Which pharmacy would you like this sent to?  [facility] [address], [place] - [address] [place] [address] Phone: [phone] Fax: [phone]    Patient notified that their request is being sent to the clinical staff for review and that they should receive a response within 2 business days.   Please advise at Mobile [phone] (mobile)

## 2024-02-02 ENCOUNTER — Other Ambulatory Visit: Payer: Self-pay | Admitting: Internal Medicine

## 2024-02-02 DIAGNOSIS — I10 Essential (primary) hypertension: Secondary | ICD-10-CM

## 2024-02-21 ENCOUNTER — Other Ambulatory Visit: Payer: Self-pay

## 2024-03-01 ENCOUNTER — Other Ambulatory Visit: Payer: Self-pay

## 2024-03-01 DIAGNOSIS — E1136 Type 2 diabetes mellitus with diabetic cataract: Secondary | ICD-10-CM

## 2024-03-01 DIAGNOSIS — E1129 Type 2 diabetes mellitus with other diabetic kidney complication: Secondary | ICD-10-CM

## 2024-03-01 MED ORDER — EMPAGLIFLOZIN 10 MG PO TABS: 10.0000 mg | ORAL_TABLET | Freq: Every day | ORAL | 3 refills | Status: DC

## 2024-03-01 NOTE — Telephone Encounter (Signed)
 Copied from CRM [id number]. Topic: Clinical - Medication Refill >> Mar 01, 2024  1:28 PM Dominique E wrote: Medication: bisoprolol -hydrochlorothiazide  (ZIAC ) 10-6.25 MG tablet  empagliflozin  (JARDIANCE ) 10 MG TABS tablet  Has the patient contacted their pharmacy? Yes (Agent: If no, request that the patient contact the pharmacy for the refill. If patient does not wish to contact the pharmacy document the reason why and proceed with request.) (Agent: If yes, when and what did the pharmacy advise?)  This is the patient's preferred pharmacy:  [facility] [address], [place] - [address] [place] [address] Phone: [phone] Fax: [phone]  Is this the correct pharmacy for this prescription? Yes If no, delete pharmacy and type the correct one.   Has the prescription been filled recently? Yes  Is the patient out of the medication? Yes  Has the patient been seen for an appointment in the last year OR does the patient have an upcoming appointment? Yes  Can we respond through MyChart? Yes  Agent: Please be advised that Rx refills may take up to 3 business days. We ask that you follow-up with your pharmacy.

## 2024-03-01 NOTE — Telephone Encounter (Signed)
 FYI Only or Action Required?: Action required by provider: medication refill request.  Patient was last seen in primary care on 11/14/2023 by Melvenia Manus BRAVO, MD.  Called Nurse Triage reporting Medication Refill.  Symptoms began today.  Interventions attempted: Nothing.  Symptoms are: stable.  Triage Disposition: No disposition on file.  Patient/caregiver understands and will follow disposition?:

## 2024-03-04 ENCOUNTER — Other Ambulatory Visit: Payer: Self-pay

## 2024-03-30 ENCOUNTER — Other Ambulatory Visit: Payer: Self-pay

## 2024-03-30 MED ORDER — BISOPROLOL-HYDROCHLOROTHIAZIDE 10-6.25 MG PO TABS: 1.0000 | ORAL_TABLET | Freq: Every day | ORAL | 0 refills | Status: DC

## 2024-03-30 NOTE — Telephone Encounter (Signed)
 Copied from CRM [id number]. Topic: Clinical - Medication Refill >> Mar 30, 2024  1:43 PM Emylou G wrote: Medication: bisoprolol -hydrochlorothiazide  (ZIAC ) 10-6.25 MG tablet  Has the patient contacted their pharmacy? No (Agent: If no, request that the patient contact the pharmacy for the refill. If patient does not wish to contact the pharmacy document the reason why and proceed with request.) (Agent: If yes, when and what did the pharmacy advise?)  This is the patient's preferred pharmacy:  [facility] [address] [place] - [address]  [place] [address] Phone: [phone] Fax: [phone]  Is this the correct pharmacy for this prescription? Yes If no, delete pharmacy and type the correct one.   Has the prescription been filled recently? No  Is the patient out of the medication? Yes  Has the patient been seen for an appointment in the last year OR does the patient have an upcoming appointment? Yes  Can we respond through MyChart? No  Agent: Please be advised that Rx refills may take up to 3 business days. We ask that you follow-up with your pharmacy.

## 2024-04-17 ENCOUNTER — Other Ambulatory Visit: Payer: Self-pay

## 2024-04-20 ENCOUNTER — Encounter (HOSPITAL_COMMUNITY): Payer: Self-pay

## 2024-04-20 ENCOUNTER — Emergency Department (HOSPITAL_COMMUNITY)
Admission: EM | Admit: 2024-04-20 | Discharge: 2024-04-20 | Disposition: A | Discharge status: Home / Self Care | Attending: Emergency Medicine | Admitting: Emergency Medicine

## 2024-04-20 ENCOUNTER — Other Ambulatory Visit: Payer: Self-pay

## 2024-04-20 DIAGNOSIS — I10 Essential (primary) hypertension: Secondary | ICD-10-CM | POA: Insufficient documentation

## 2024-04-20 DIAGNOSIS — Z7984 Long term (current) use of oral hypoglycemic drugs: Secondary | ICD-10-CM | POA: Insufficient documentation

## 2024-04-20 DIAGNOSIS — B354 Tinea corporis: Secondary | ICD-10-CM | POA: Insufficient documentation

## 2024-04-20 DIAGNOSIS — E119 Type 2 diabetes mellitus without complications: Secondary | ICD-10-CM | POA: Insufficient documentation

## 2024-04-20 DIAGNOSIS — Z7982 Long term (current) use of aspirin: Secondary | ICD-10-CM | POA: Diagnosis not present

## 2024-04-20 DIAGNOSIS — Z79899 Other long term (current) drug therapy: Secondary | ICD-10-CM | POA: Insufficient documentation

## 2024-04-20 DIAGNOSIS — L299 Pruritus, unspecified: Secondary | ICD-10-CM | POA: Diagnosis present

## 2024-04-20 MED ORDER — KETOCONAZOLE 2 % EX CREA: 1.0000 | TOPICAL_CREAM | Freq: Every day | CUTANEOUS | 0 refills | Status: AC

## 2024-04-20 MED ORDER — TRIAMCINOLONE ACETONIDE 0.1 % EX CREA
1.0000 | TOPICAL_CREAM | Freq: Two times a day (BID) | CUTANEOUS | 0 refills | Status: AC
Start: 2024-04-20 — End: ?

## 2024-04-20 NOTE — ED Provider Notes (Signed)
 [place] EMERGENCY DEPARTMENT AT [facility] Provider Note   CSN: [id number] Arrival date & time: 04/20/24  1146     Patient presents with: Skin Problem   Janet Short is a 73 y.o. female.   HPI     Janet Short is a 73 y.o. female past medical history of hypertension, type 2 diabetes, anemia who presents to the Emergency department requesting evaluation of recurrent itching sore to dorsal left hand.  States the area has been there on this occurrence for 2 weeks.  She endorses 2 prior occurrences of similar appearing lesion as well.  She notes itching to the area and blisters that will coalesce and rupture.  She describes mild oozing of yellow to clearish appearing fluid.  Denies any other rash, pain of the area, body aches, chills or fever.  No known injury of the hand, edema or redness or red streaks from the lesion.  She has been keeping the area covered and applying Neosporin ointment without improvement.  Prior to Admission medications   Medication Sig Start Date End Date Taking? Authorizing Provider  amLODipine  (NORVASC ) 10 MG tablet Take 1 tablet by mouth once daily 02/02/24   Bevely Doffing, FNP  aspirin EC 81 MG tablet Take 81 mg by mouth daily.    [provider]  atorvastatin  (LIPITOR) 20 MG tablet TAKE 1 TABLET BY MOUTH ONCE DAILY IN THE EVENING 04/19/24   Bevely Doffing, FNP  bisoprolol -hydrochlorothiazide  (ZIAC ) 10-6.25 MG tablet Take 1 tablet by mouth daily. 03/30/24   Bevely Doffing, FNP  cholecalciferol (VITAMIN D ) 1000 units tablet Take 1 tablet by mouth daily.     [provider]  empagliflozin  (JARDIANCE ) 10 MG TABS tablet Take 1 tablet (10 mg total) by mouth daily before breakfast. 03/01/24   Bevely Doffing, FNP  ibuprofen  (ADVIL ,MOTRIN ) 600 MG tablet Take 600 mg by mouth every 8 (eight) hours as needed for mild pain or moderate pain.  02/20/18   [provider]  lisinopril  (ZESTRIL ) 20 MG tablet Take 1 tablet (20 mg total)  by mouth daily. 01/21/24   Bevely Doffing, FNP  metFORMIN  (GLUCOPHAGE ) 500 MG tablet Take 2 tablets (1,000 mg total) by mouth 2 (two) times daily with a meal. 12/01/23   Bevely Doffing, FNP    Allergies: Patient has no known allergies.    Review of Systems  Constitutional:  Negative for appetite change, chills and fever.  Respiratory:  Negative for shortness of breath.   Cardiovascular:  Negative for chest pain.  Musculoskeletal:  Negative for arthralgias and myalgias.  Skin:  Positive for wound.  Neurological:  Negative for weakness.    Updated Vital Signs BP (!) 142/65   Pulse 66   Temp 98.4 F (36.9 C) (Oral)   Resp 18   Ht 5' 1 (1.549 m)   Wt 78.5 kg   SpO2 99%   BMI 32.69 kg/m   Physical Exam Constitutional:      General: She is not in acute distress.    Appearance: Normal appearance. She is not ill-appearing or toxic-appearing.  Cardiovascular:     Rate and Rhythm: Normal rate and regular rhythm.     Pulses: Normal pulses.  Pulmonary:     Effort: Pulmonary effort is normal.  Musculoskeletal:        General: Normal range of motion.     Cervical back: Normal range of motion.  Skin:    General: Skin is warm.     Capillary Refill: Capillary refill takes  less than 2 seconds.     Findings: Lesion present. No erythema.     Comments: Single 3 cm lesion dorsal left hand with well-defined borders.  Central scab.  No drainage, induration or fluctuance.  Pruritic.  No surrounding erythema or lymphangitis.  See attached photo  Neurological:     General: No focal deficit present.     Mental Status: She is alert.     Sensory: No sensory deficit.     Motor: No weakness.     (all labs ordered are listed, but only abnormal results are displayed) Labs Reviewed - No data to display  EKG: None  Radiology: No results found.   Procedures   Medications Ordered in the ED - No data to display                                  Medical Decision Making   Patient here  with single lesion dorsal left hand that is recurrent.  Current symptoms present for 2 weeks.  Although she states area temporarily improves has never resolved since onset.  No known injury to her hand.  Symptoms associated with itching, no pain or swelling.  Has well-defined border no other skin lesions or rash.  No systemic symptoms.  I suspect this is ringworm versus nummular eczema.  Doubt abscess.  Skin cancer also considered  Amount and/or Complexity of Data Reviewed Discussion of management or test interpretation with external provider(s):   Patient well-appearing.  Doubt emergent process.  Will treat symptomatically and recommend patient follow-up outpatient with dermatology if not improving        Final diagnoses:  Tinea corporis    ED Discharge Orders     None          Herlinda Milling, PA-C 04/20/24 1548    Freddi Hamilton, MD 04/22/24 [phone]

## 2024-04-20 NOTE — ED Triage Notes (Signed)
 Pt presents to ED with sore on left hand, states has come back 3 times.

## 2024-04-20 NOTE — Discharge Instructions (Signed)
 Apply the cream as directed for at least 1 to 2 weeks.  May keep the area bandaged if needed.  Avoid scratching.  Please follow-up with one of the dermatology providers listed.  You may call to make an appointment.

## 2024-04-27 ENCOUNTER — Other Ambulatory Visit: Payer: Self-pay

## 2024-04-30 ENCOUNTER — Encounter (INDEPENDENT_AMBULATORY_CARE_PROVIDER_SITE_OTHER): Payer: Self-pay | Admitting: *Deleted

## 2024-05-02 ENCOUNTER — Other Ambulatory Visit: Payer: Self-pay

## 2024-05-02 DIAGNOSIS — I10 Essential (primary) hypertension: Secondary | ICD-10-CM

## 2024-05-03 ENCOUNTER — Other Ambulatory Visit: Payer: Self-pay

## 2024-05-03 MED ORDER — BISOPROLOL-HYDROCHLOROTHIAZIDE 10-6.25 MG PO TABS: 1.0000 | ORAL_TABLET | Freq: Every day | ORAL | 0 refills | Status: DC

## 2024-05-03 NOTE — Telephone Encounter (Signed)
 Copied from CRM #[id number]. Topic: Clinical - Medication Refill >> May 03, 2024  9:19 AM Donna BRAVO wrote: Medication:  bisoprolol -hydrochlorothiazide  (ZIAC ) 10-6.25 MG tablet  Has the patient contacted their pharmacy? No No refills listed on bottle  This is the patient's preferred pharmacy:   [facility] [address], [place] - [address] [place] [address] Phone: [phone] Fax: [phone]  Is this the correct pharmacy for this prescription? Yes If no, delete pharmacy and type the correct one.   Has the prescription been filled recently? Yes  Is the patient out of the medication? No 1 day left  Has the patient been seen for an appointment in the last year OR does the patient have an upcoming appointment? Yes  Can we respond through MyChart? No  Agent: Please be advised that Rx refills may take up to 3 business days. We ask that you follow-up with your pharmacy.

## 2024-05-18 ENCOUNTER — Ambulatory Visit

## 2024-05-18 VITALS — BP 119/75 | HR 74 | Ht 61.0 in | Wt 175.0 lb

## 2024-05-18 DIAGNOSIS — E559 Vitamin D deficiency, unspecified: Secondary | ICD-10-CM | POA: Insufficient documentation

## 2024-05-18 DIAGNOSIS — F5101 Primary insomnia: Secondary | ICD-10-CM | POA: Insufficient documentation

## 2024-05-18 DIAGNOSIS — I10 Essential (primary) hypertension: Secondary | ICD-10-CM

## 2024-05-18 DIAGNOSIS — E119 Type 2 diabetes mellitus without complications: Secondary | ICD-10-CM | POA: Insufficient documentation

## 2024-05-18 DIAGNOSIS — E1169 Type 2 diabetes mellitus with other specified complication: Secondary | ICD-10-CM

## 2024-05-18 MED ORDER — TRAZODONE HCL 50 MG PO TABS: 25.0000 mg | ORAL_TABLET | Freq: Every evening | ORAL | 5 refills | Status: AC | PRN

## 2024-05-18 MED ORDER — VITAMIN D (ERGOCALCIFEROL) 1.25 MG (50000 UNIT) PO CAPS: 50000.0000 [IU] | ORAL_CAPSULE | ORAL | 3 refills | Status: AC

## 2024-05-18 MED ORDER — METFORMIN HCL 500 MG PO TABS: 1000.0000 mg | ORAL_TABLET | Freq: Two times a day (BID) | ORAL | 3 refills | Status: AC

## 2024-05-18 MED ORDER — IBUPROFEN 600 MG PO TABS: 600.0000 mg | ORAL_TABLET | Freq: Three times a day (TID) | ORAL | 5 refills | Status: AC | PRN

## 2024-05-18 NOTE — Assessment & Plan Note (Signed)
 Blood pressure is well controlled

## 2024-05-18 NOTE — Progress Notes (Signed)
 Established Patient Office Visit  Subjective   Patient ID: Janet Short, female    DOB: 20-Jul-1950  Age: 73 y.o. MRN: [id number]  Chief Complaint  Patient presents with   Medical Management of Chronic Issues    Pt here for a 6 month follow up     HPI Discussed the use of AI scribe software for clinical note transcription with the patient, who gave verbal consent to proceed.  History of Present Illness    Janet Short is a 73 year old female who presents for a six-month follow-up visit.  Dermatologic symptoms - Recent episode of a large blister that appeared overnight, associated with pruritus - Sought care in the emergency room for the blister - Blister resolved without use of topical creams - Two facial lesions treated with topical cream, with improvement  Sleep disturbance - Difficulty initiating sleep since the loss of her husband fourteen years ago - No use of over-the-counter sleep aids  Glycemic management - Currently taking metformin  1000 mg twice daily - Requests medication refill - No numbness or tingling in the feet  Musculoskeletal pain - Uses over-the-counter ibuprofen , but finds it ineffective - Requests prescription-strength ibuprofen   Vitamin d  supplementation - Takes vitamin D3 1000 IU daily - Concerned that the supplement may be the incorrect type      Patient Active Problem List   Diagnosis Date Noted   Type 2 diabetes mellitus without complication, without long-term current use of insulin (HCC) 05/18/2024   Vitamin D  deficiency 05/18/2024   Primary insomnia 05/18/2024   Pain in finger of right hand 11/14/2023   Breast cancer screening by mammogram 11/14/2023   Microalbuminuria due to type 2 diabetes mellitus (HCC) 05/09/2023   Essential hypertension 02/06/2023   Hyperlipidemia associated with type 2 diabetes mellitus (HCC) 02/06/2023   Type 2 diabetes mellitus with ophthalmic complication (HCC) 02/06/2023   Facial rash 02/06/2023     ROS    Objective:     BP 119/75   Pulse 74   Ht 5' 1 (1.549 m)   Wt 175 lb (79.4 kg)   SpO2 95%   BMI 33.07 kg/m  BP Readings from Last 3 Encounters:  05/18/24 119/75  04/20/24 (!) 142/65  11/14/23 132/82   Wt Readings from Last 3 Encounters:  05/18/24 175 lb (79.4 kg)  04/20/24 173 lb (78.5 kg)  11/14/23 185 lb 12.8 oz (84.3 kg)     Physical Exam Vitals and nursing note reviewed.  Constitutional:      Appearance: Normal appearance.  HENT:     Head: Normocephalic.  Eyes:     Extraocular Movements: Extraocular movements intact.     Pupils: Pupils are equal, round, and reactive to light.  Cardiovascular:     Rate and Rhythm: Normal rate and regular rhythm.  Pulmonary:     Effort: Pulmonary effort is normal.     Breath sounds: Normal breath sounds.  Musculoskeletal:     Cervical back: Normal range of motion and neck supple.  Neurological:     Mental Status: She is alert and oriented to person, place, and time.  Psychiatric:        Mood and Affect: Mood normal.        Thought Content: Thought content normal.     Diabetic foot exam was performed with the following findings:   No deformities, ulcerations, or other skin breakdown Normal sensation of 10g monofilament Intact posterior tibialis and dorsalis pedis pulses      No  results found for any visits on 05/18/24.  Last CBC Lab Results  Component Value Date   WBC 5.8 02/06/2023   HGB 13.1 02/06/2023   HCT 40.2 02/06/2023   MCV 83 02/06/2023   MCH 27.2 02/06/2023   RDW 13.0 02/06/2023   PLT 216 02/06/2023   Last metabolic panel Lab Results  Component Value Date   GLUCOSE 173 (H) 11/14/2023   NA 138 11/14/2023   K 4.3 11/14/2023   CL 96 11/14/2023   CO2 22 11/14/2023   BUN 12 11/14/2023   CREATININE 0.90 11/14/2023   EGFR 68 11/14/2023   CALCIUM  9.7 11/14/2023   PROT 7.0 02/06/2023   ALBUMIN 4.3 02/06/2023   LABGLOB 2.7 02/06/2023   BILITOT 0.3 02/06/2023   ALKPHOS 58 02/06/2023    AST 20 02/06/2023   ALT 21 02/06/2023   ANIONGAP 11 02/20/2018   Last lipids Lab Results  Component Value Date   CHOL 131 02/06/2023   HDL 53 02/06/2023   LDLCALC 55 02/06/2023   TRIG 135 02/06/2023   CHOLHDL 2.5 02/06/2023   Last hemoglobin A1c Lab Results  Component Value Date   HGBA1C 7.3 (H) 11/14/2023   Last thyroid functions Lab Results  Component Value Date   TSH 0.842 02/06/2023   FREET4 1.35 02/06/2023   Last vitamin D  Lab Results  Component Value Date   VD25OH 54.4 02/06/2023      The 10-year ASCVD risk score (Arnett DK, et al., 2019) is: 20%    Assessment & Plan:   Problem List Items Addressed This Visit       Cardiovascular and Mediastinum   Essential hypertension - Primary   Blood pressure is well-controlled.      Relevant Orders   CMP14+EGFR     Endocrine   Hyperlipidemia associated with type 2 diabetes mellitus (HCC)   Adequately controlled with atorvastatin  20 mg daily.  LDL 55 on lipid panel from August.      Relevant Medications   metFORMIN  (GLUCOPHAGE ) 500 MG tablet   Other Relevant Orders   CMP14+EGFR   Type 2 diabetes mellitus without complication, without long-term current use of insulin (HCC)   Managed with metformin . - Refilled metformin  prescription at [facility]. - Ordered A1c test.      Relevant Medications   metFORMIN  (GLUCOPHAGE ) 500 MG tablet   Other Relevant Orders   Hemoglobin A1c   CMP14+EGFR   CBC with Differential/Platelet   Urine Microalbumin w/creat. ratio   HM Diabetes Foot Exam (Completed)     Other   Vitamin D  deficiency   Currently taking vitamin D3 1000 IU, which is appropriate. - Prescribed once weekly vitamin D  supplement.      Relevant Medications   Vitamin D , Ergocalciferol , (DRISDOL) 1.25 MG (50000 UNIT) CAPS capsule   Primary insomnia   Difficulty sleeping, possibly related to recent bereavement.  Her husband passed away seven months ago. - Provided trazodone to try.      Relevant  Medications   traZODone (DESYREL) 50 MG tablet    No follow-ups on file.    Leita Longs, FNP

## 2024-05-18 NOTE — Assessment & Plan Note (Signed)
 Difficulty sleeping, possibly related to recent bereavement.  Her husband passed away seven months ago. - Provided trazodone to try.

## 2024-05-18 NOTE — Assessment & Plan Note (Signed)
 Adequately controlled with atorvastatin 20 mg daily.  LDL 55 on lipid panel from August.

## 2024-05-18 NOTE — Assessment & Plan Note (Signed)
 Currently taking vitamin D3 1000 IU, which is appropriate. - Prescribed once weekly vitamin D  supplement.

## 2024-05-18 NOTE — Assessment & Plan Note (Signed)
 Managed with metformin . - Refilled metformin  prescription at [facility]. - Ordered A1c test.

## 2024-05-20 LAB — CMP14+EGFR
ALT: 14 IU/L (ref 0–32)
AST: 15 IU/L (ref 0–40)
Albumin: 4.5 g/dL (ref 3.8–4.8)
Alkaline Phosphatase: 62 IU/L (ref 49–135)
BUN/Creatinine Ratio: 10 — ABNORMAL LOW (ref 12–28)
BUN: 10 mg/dL (ref 8–27)
Bilirubin Total: 0.5 mg/dL (ref 0.0–1.2)
CO2: 24 mmol/L (ref 20–29)
Calcium: 10.1 mg/dL (ref 8.7–10.3)
Chloride: 98 mmol/L (ref 96–106)
Creatinine, Ser: 0.97 mg/dL (ref 0.57–1.00)
Globulin, Total: 2.7 g/dL (ref 1.5–4.5)
Glucose: 167 mg/dL — ABNORMAL HIGH (ref 70–99)
Potassium: 4 mmol/L (ref 3.5–5.2)
Sodium: 138 mmol/L (ref 134–144)
Total Protein: 7.2 g/dL (ref 6.0–8.5)
eGFR: 61 mL/min/1.73 (ref >59)

## 2024-05-20 LAB — CBC WITH DIFFERENTIAL/PLATELET
Basophils Absolute: 0.1 x10E3/uL (ref 0.0–0.2)
Basos: 1 %
EOS (ABSOLUTE): 0.3 x10E3/uL (ref 0.0–0.4)
Eos: 4 %
Hematocrit: 43.3 % (ref 34.0–46.6)
Hemoglobin: 14.2 g/dL (ref 11.1–15.9)
Immature Grans (Abs): 0 x10E3/uL (ref 0.0–0.1)
Immature Granulocytes: 0 %
Lymphocytes Absolute: 2.1 x10E3/uL (ref 0.7–3.1)
Lymphs: 32 %
MCH: 28 pg (ref 26.6–33.0)
MCHC: 32.8 g/dL (ref 31.5–35.7)
MCV: 85 fL (ref 79–97)
Monocytes Absolute: 0.4 x10E3/uL (ref 0.1–0.9)
Monocytes: 7 %
Neutrophils Absolute: 3.5 x10E3/uL (ref 1.4–7.0)
Neutrophils: 55 %
Platelets: 242 x10E3/uL (ref 150–450)
RBC: 5.08 x10E6/uL (ref 3.77–5.28)
RDW: 13.6 % (ref 11.7–15.4)
WBC: 6.3 x10E3/uL (ref 3.4–10.8)

## 2024-05-20 LAB — MICROALBUMIN / CREATININE URINE RATIO
Creatinine, Urine: 71.1 mg/dL
Microalb/Creat Ratio: 12 mg/g{creat} (ref 0–29)
Microalbumin, Urine: 8.2 ug/mL

## 2024-05-20 LAB — HEMOGLOBIN A1C
Est. average glucose Bld gHb Est-mCnc: 166 mg/dL
Hgb A1c MFr Bld: 7.4 % — ABNORMAL HIGH (ref 4.8–5.6)

## 2024-05-25 ENCOUNTER — Other Ambulatory Visit: Payer: Self-pay

## 2024-05-25 ENCOUNTER — Telehealth: Payer: Self-pay

## 2024-05-25 DIAGNOSIS — E1129 Type 2 diabetes mellitus with other diabetic kidney complication: Secondary | ICD-10-CM

## 2024-05-25 DIAGNOSIS — E782 Mixed hyperlipidemia: Secondary | ICD-10-CM

## 2024-05-25 DIAGNOSIS — E1136 Type 2 diabetes mellitus with diabetic cataract: Secondary | ICD-10-CM

## 2024-05-25 MED ORDER — EMPAGLIFLOZIN 10 MG PO TABS: 10.0000 mg | ORAL_TABLET | Freq: Every day | ORAL | 3 refills | Status: AC

## 2024-05-25 MED ORDER — ATORVASTATIN CALCIUM 20 MG PO TABS: 20.0000 mg | ORAL_TABLET | Freq: Every evening | ORAL | 0 refills | Status: DC

## 2024-05-25 NOTE — Telephone Encounter (Signed)
 Copied from CRM [id number]. Topic: Clinical - Medication Refill >> May 25, 2024  9:00 AM Wess RAMAN wrote: Medication: atorvastatin  (LIPITOR) 20 MG tablet  empagliflozin  (JARDIANCE ) 10 MG TABS tablet  Has the patient contacted their pharmacy? No (Agent: If no, request that the patient contact the pharmacy for the refill. If patient does not wish to contact the pharmacy document the reason why and proceed with request.) (Agent: If yes, when and what did the pharmacy advise?)  This is the patient's preferred pharmacy:  [facility] [address], [place] - [address] [place] [address] Phone: [phone] Fax: [phone]  Is this the correct pharmacy for this prescription? Yes If no, delete pharmacy and type the correct one.   Has the prescription been filled recently? Yes  Is the patient out of the medication? No  Has the patient been seen for an appointment in the last year OR does the patient have an upcoming appointment? Yes  Can we respond through MyChart? Yes  Agent: Please be advised that Rx refills may take up to 3 business days. We ask that you follow-up with your pharmacy.

## 2024-05-25 NOTE — Progress Notes (Signed)
   05/25/2024  Patient ID: Janet Short, female   DOB: [birth year], 73 y.o.   MRN: [id number]  Reviewed message from [facility] regarding MAC, refill sent in today 05/25/2024 after patient talked with [facility]. No action required at this time.

## 2024-05-25 NOTE — Progress Notes (Signed)
 Pharmacy Quality Measure Review  This patient is appearing on a report for being at risk of failing the adherence measure for cholesterol (statin) medications this calendar year.   Medication: Atorvastatin  20mg  Last fill date: 04/19/24 for 30 day supply  Unable to leave voice mail, attempted call. Room exists for adherence optimization for this medication. Is likely out of refills. Will notify clinic PharmD to send refills and/or collaborate with prescriber (next appointment scheduled for June 2026).  Javeah Loeza, PharmD [facility] Pharmacist

## 2024-06-01 NOTE — Progress Notes (Signed)
 Janet Short                                          MRN: [id number]   06/01/2024   The [facility] Quality Team Specialist reviewed this patient medical record for the purposes of chart review for care gap closure. The following were reviewed: abstraction for care gap closure-kidney health evaluation for diabetes:eGFR  and uACR.    [facility] Quality Team

## 2024-06-04 ENCOUNTER — Other Ambulatory Visit: Payer: Self-pay

## 2024-06-04 DIAGNOSIS — E1129 Type 2 diabetes mellitus with other diabetic kidney complication: Secondary | ICD-10-CM

## 2024-06-04 DIAGNOSIS — E1136 Type 2 diabetes mellitus with diabetic cataract: Secondary | ICD-10-CM

## 2024-06-04 MED ORDER — BISOPROLOL-HYDROCHLOROTHIAZIDE 10-6.25 MG PO TABS: 1.0000 | ORAL_TABLET | Freq: Every day | ORAL | 0 refills | Status: DC

## 2024-06-04 MED ORDER — EMPAGLIFLOZIN 10 MG PO TABS: 10.0000 mg | ORAL_TABLET | Freq: Every day | ORAL | 3 refills | Status: AC

## 2024-06-04 NOTE — Telephone Encounter (Signed)
 Copied from CRM #[id number]. Topic: Clinical - Medication Refill >> Jun 04, 2024  9:21 AM Everette C wrote: Medication: bisoprolol -hydrochlorothiazide  (ZIAC ) 10-6.25 MG tablet [[id number]]  JARDIANCE  10 MG TABS tablet [[id number]]   Has the patient contacted their pharmacy? Yes (Agent: If no, request that the patient contact the pharmacy for the refill. If patient does not wish to contact the pharmacy document the reason why and proceed with request.) (Agent: If yes, when and what did the pharmacy advise?)  This is the patient's preferred pharmacy:  [facility] [address] [place] - [address] [place] [address] Phone: [phone] Fax: [phone]  Is this the correct pharmacy for this prescription? Yes If no, delete pharmacy and type the correct one.   Has the prescription been filled recently? Yes  Is the patient out of the medication? Yes  Has the patient been seen for an appointment in the last year OR does the patient have an upcoming appointment? Yes  Can we respond through MyChart? No  Agent: Please be advised that Rx refills may take up to 3 business days. We ask that you follow-up with your pharmacy.

## 2024-06-25 ENCOUNTER — Other Ambulatory Visit: Payer: Self-pay

## 2024-06-25 MED ORDER — ATORVASTATIN CALCIUM 20 MG PO TABS: 20.0000 mg | ORAL_TABLET | Freq: Every evening | ORAL | 0 refills | Status: AC

## 2024-06-25 NOTE — Telephone Encounter (Signed)
 Copied from CRM [id number]. Topic: Clinical - Medication Refill >> Jun 25, 2024  9:47 AM Victoria B wrote: Medication: atorvastatin  (LIPITOR) 20 MG tablet  Has the patient contacted their pharmacy? no  This is the patient's preferred pharmacy:  [facility] [address] [place] - [address] [place] [address] Phone: [phone] Fax: [phone]  Is this the correct pharmacy for this prescription? yes   Has the prescription been filled recently? no  Is the patient out of the medication? yes  Has the patient been seen for an appointment in the last year OR does the patient have an upcoming appointment? yes  Can we respond through MyChart? no  Agent: Please be advised that Rx refills may take up to 3 business days. We ask that you follow-up with your pharmacy.

## 2024-07-06 ENCOUNTER — Other Ambulatory Visit: Payer: Self-pay

## 2024-11-16 ENCOUNTER — Ambulatory Visit
